# Patient Record
Sex: Female | Born: 1995 | Hispanic: No | Marital: Married
Health system: Southern US, Community
[De-identification: ages and names within clinical notes are randomized; demographics above are authoritative.]

## PROBLEM LIST (undated history)

## (undated) DIAGNOSIS — J45909 Unspecified asthma, uncomplicated: Secondary | ICD-10-CM

## (undated) DIAGNOSIS — R011 Cardiac murmur, unspecified: Secondary | ICD-10-CM

## (undated) HISTORY — PX: TYMPANOPLASTY: SHX33

---
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
## 2017-05-23 ENCOUNTER — Encounter: Payer: Self-pay | Admitting: Emergency Medicine

## 2017-05-23 ENCOUNTER — Emergency Department: Payer: BLUE CROSS/BLUE SHIELD

## 2017-05-23 DIAGNOSIS — W101XXA Fall (on)(from) sidewalk curb, initial encounter: Secondary | ICD-10-CM | POA: Diagnosis not present

## 2017-05-23 DIAGNOSIS — S93401A Sprain of unspecified ligament of right ankle, initial encounter: Secondary | ICD-10-CM | POA: Insufficient documentation

## 2017-05-23 DIAGNOSIS — Y929 Unspecified place or not applicable: Secondary | ICD-10-CM | POA: Insufficient documentation

## 2017-05-23 DIAGNOSIS — S99911A Unspecified injury of right ankle, initial encounter: Secondary | ICD-10-CM | POA: Diagnosis present

## 2017-05-23 DIAGNOSIS — Y93K1 Activity, walking an animal: Secondary | ICD-10-CM | POA: Diagnosis not present

## 2017-05-23 DIAGNOSIS — Y999 Unspecified external cause status: Secondary | ICD-10-CM | POA: Diagnosis not present

## 2017-05-23 NOTE — ED Triage Notes (Signed)
Pt c/o right ankle and foot pain post fall from sidewalk tonight while walking dog. Pt denies hitting head and LOC. Swelling noted to area of injury.

## 2017-05-24 ENCOUNTER — Emergency Department
Admission: EM | Admit: 2017-05-24 | Discharge: 2017-05-24 | Disposition: A | Discharge status: Home / Self Care | Payer: BLUE CROSS/BLUE SHIELD | Attending: Emergency Medicine | Admitting: Emergency Medicine

## 2017-05-24 DIAGNOSIS — S93401A Sprain of unspecified ligament of right ankle, initial encounter: Secondary | ICD-10-CM

## 2017-05-24 DIAGNOSIS — M25571 Pain in right ankle and joints of right foot: Secondary | ICD-10-CM

## 2017-05-24 MED ORDER — HYDROCODONE-ACETAMINOPHEN 5-325 MG PO TABS: 1.0000 | ORAL_TABLET | Freq: Four times a day (QID) | ORAL | 0 refills | Status: DC | PRN

## 2017-05-24 MED ORDER — IBUPROFEN 800 MG PO TABS
800.0000 mg | ORAL_TABLET | Freq: Once | ORAL | Status: AC
  Administered 2017-05-24: 800 mg via ORAL
  Filled 2017-05-24: qty 1

## 2017-05-24 MED ORDER — HYDROCODONE-ACETAMINOPHEN 5-325 MG PO TABS
1.0000 | ORAL_TABLET | Freq: Once | ORAL | Status: AC
  Administered 2017-05-24: 1 via ORAL
  Filled 2017-05-24: qty 1

## 2017-05-24 MED ORDER — IBUPROFEN 800 MG PO TABS: 800.0000 mg | ORAL_TABLET | Freq: Three times a day (TID) | ORAL | 0 refills | Status: DC | PRN

## 2017-05-24 NOTE — ED Provider Notes (Signed)
[facility] Emergency Department Provider Note   ____________________________________________   First MD Initiated Contact with Patient 05/24/17 0304     (approximate)  I have reviewed the triage vital signs and the nursing notes.   HISTORY  Chief Complaint Ankle Pain    HPI Lindsey Thompson is a 21 y.o. female presents with pain status post fall.  Patient reports she was walking her dog when she stepped off the curb and rolled her ankle.  Complains of right ankle pain and swelling.  Denies extremity weakness, numbness or tingling.  Denies striking head or LOC.  Denies headache, vision changes, neck pain, shortness of breath, nausea or vomiting. Nothing makes her symptoms better; ambulation makes her symptoms worse.   Past medical history None  There are no active problems to display for this patient.   History reviewed. No pertinent surgical history.  Prior to Admission medications   Not on File    Allergies Patient has no known allergies.  History reviewed. No pertinent family history.  Social History Social History  Substance Use Topics  . Smoking status: Never Smoker  . Smokeless tobacco: Never Used  . Alcohol use No    Review of Systems  Constitutional: No fever/chills. Eyes: No visual changes. ENT: No sore throat. Cardiovascular: Denies chest pain. Respiratory: Denies shortness of breath. Gastrointestinal: No abdominal pain.  No nausea, no vomiting.  No diarrhea.  No constipation. Genitourinary: Negative for dysuria. Musculoskeletal: Positive for right ankle pain and swelling.  Negative for back pain. Skin: Negative for rash. Neurological: Negative for headaches, focal weakness or numbness.   ____________________________________________   PHYSICAL EXAM:  VITAL SIGNS: ED Triage Vitals  Enc Vitals Group     BP 05/23/17 2241 126/80     Pulse Rate 05/23/17 2241 100     Resp 05/23/17 2241 16     Temp 05/23/17 2241 97.9 F  (36.6 C)     Temp Source 05/23/17 2241 Oral     SpO2 05/23/17 2241 100 %     Weight --      Height 05/23/17 2234 5\' 10"  (1.778 m)     Head Circumference --      Peak Flow --      Pain Score --      Pain Loc --      Pain Edu? --      Excl. in GC? --     Constitutional: Alert and oriented. Well appearing and in no acute distress. Eyes: Conjunctivae are normal. PERRL. EOMI. Head: Atraumatic. Nose: No congestion/rhinnorhea. Mouth/Throat: Mucous membranes are moist.  Oropharynx non-erythematous. Neck: No stridor.  No cervical spine tenderness to palpation. Cardiovascular: Normal rate, regular rhythm. Grossly normal heart sounds.  Good peripheral circulation. Respiratory: Normal respiratory effort.  No retractions. Lungs CTAB. Gastrointestinal: Soft and nontender. No distention. No abdominal bruits. No CVA tenderness. Musculoskeletal:  RLE: Mild to moderate swelling ankle, maximally at lateral malleous and anterior foot.  Limited range of motion secondary to pain.  2+ distal pulses.  Brisk, less than 5-second capillary refill. Neurologic:  Normal speech and language. No gross focal neurologic deficits are appreciated.  Skin:  Skin is warm, dry and intact. No rash noted. Psychiatric: Mood and affect are normal. Speech and behavior are normal.  ____________________________________________   LABS (all labs ordered are listed, but only abnormal results are displayed)  Labs Reviewed - No data to display ____________________________________________  EKG  None ____________________________________________  RADIOLOGY  Dg Ankle 2 Views Right  Result  Date: 05/23/2017 CLINICAL DATA:  Fall with right ankle and foot pain EXAM: RIGHT ANKLE - 2 VIEW COMPARISON:  None. FINDINGS: There is no evidence of fracture, dislocation, or joint effusion. There is no evidence of arthropathy or other focal bone abnormality. Moderate circumferential soft tissue swelling. IMPRESSION: Moderate right ankle  circumferential soft tissue swelling. No acute fracture or dislocation. Electronically Signed   By: Deatra RobinsonKevin  Herman M.D.   On: 05/23/2017 23:25   Dg Foot Complete Right  Result Date: 05/23/2017 CLINICAL DATA:  Fall with ankle and foot pain EXAM: RIGHT FOOT COMPLETE - 3+ VIEW COMPARISON:  None. FINDINGS: There is no evidence of fracture or dislocation. There is no evidence of arthropathy or other focal bone abnormality. Right ankle soft tissue swelling. IMPRESSION: No fracture or dislocation of the right foot. Electronically Signed   By: Deatra RobinsonKevin  Herman M.D.   On: 05/23/2017 23:27    ____________________________________________   PROCEDURES  Procedure(s) performed: None  Procedures  Critical Care performed: No  ____________________________________________   INITIAL IMPRESSION / ASSESSMENT AND PLAN / ED COURSE  As part of my medical decision making, I reviewed the following data within the electronic MEDICAL RECORD NUMBER History obtained from family, Nursing notes reviewed and incorporated, Radiograph reviewed and Notes from prior ED visits.   21 year old female who presents with right ankle sprain s/p fall. Come with her own crutches. Will administer NSAIDs, analgesia, place in ankle stirrup splint, and refer to orthopedics for outpatient follow-up. Strict return precautions given. Patient and family verbalize understanding and agree with plan of care.      ____________________________________________   FINAL CLINICAL IMPRESSION(S) / ED DIAGNOSES  Final diagnoses:  Acute right ankle pain  Sprain of right ankle, unspecified ligament, initial encounter      NEW MEDICATIONS STARTED DURING THIS VISIT:  New Prescriptions   No medications on file     Note:  This document was prepared using Dragon voice recognition software and may include unintentional dictation errors.    Irean HongSung, Hope Holst J, MD 05/24/17 [phone]

## 2017-05-24 NOTE — Discharge Instructions (Signed)
1.  You may take pain medicines as needed (Motrin/Norco #15). 2. Wear splint and use your crutches to walk as needed. 3. Elevate affected area and apply ice over splint several times daily. 4. Return to the ER for worsening symptoms, persistent vomiting, difficulty breathing or other concerns.

## 2017-05-24 NOTE — ED Notes (Signed)
Pt uprite on stretcher in exam room with no distress noted; SO at bedside; pt reports PTA tripped while taking her dog outside; c/o pain right ankle/foot; +PP, brisk cap refill, W&D, good movement/sensation; skin intact; denies any other c/o or injuries

## 2017-11-26 ENCOUNTER — Other Ambulatory Visit: Payer: Self-pay

## 2017-11-26 ENCOUNTER — Emergency Department
Admission: EM | Admit: 2017-11-26 | Discharge: 2017-11-27 | Disposition: A | Discharge status: Home / Self Care | Payer: Managed Care, Other (non HMO) | Attending: Student in an Organized Health Care Education/Training Program | Admitting: Student in an Organized Health Care Education/Training Program

## 2017-11-26 ENCOUNTER — Emergency Department: Payer: Managed Care, Other (non HMO)

## 2017-11-26 ENCOUNTER — Encounter: Payer: Self-pay | Admitting: Emergency Medicine

## 2017-11-26 DIAGNOSIS — R1031 Right lower quadrant pain: Secondary | ICD-10-CM | POA: Diagnosis present

## 2017-11-26 DIAGNOSIS — N83209 Unspecified ovarian cyst, unspecified side: Secondary | ICD-10-CM | POA: Insufficient documentation

## 2017-11-26 LAB — PREGNANCY, URINE: Preg Test, Ur: NEGATIVE

## 2017-11-26 LAB — COMPREHENSIVE METABOLIC PANEL
ALT: 24 U/L (ref 14–54)
ANION GAP: 6 (ref 5–15)
AST: 20 U/L (ref 15–41)
Albumin: 4.1 g/dL (ref 3.5–5.0)
Alkaline Phosphatase: 49 U/L (ref 38–126)
BUN: 11 mg/dL (ref 6–20)
CHLORIDE: 108 mmol/L (ref 101–111)
CO2: 23 mmol/L (ref 22–32)
CREATININE: 0.92 mg/dL (ref 0.44–1.00)
Calcium: 9.4 mg/dL (ref 8.9–10.3)
GLUCOSE: 96 mg/dL (ref 65–99)
Potassium: 3.8 mmol/L (ref 3.5–5.1)
Sodium: 137 mmol/L (ref 135–145)
Total Bilirubin: 0.4 mg/dL (ref 0.3–1.2)
Total Protein: 7.8 g/dL (ref 6.5–8.1)

## 2017-11-26 LAB — LIPASE, BLOOD: Lipase: 30 U/L (ref 11–51)

## 2017-11-26 LAB — CBC
HCT: 42.8 % (ref 35.0–47.0)
Hemoglobin: 14.4 g/dL (ref 12.0–16.0)
MCH: 29.7 pg (ref 26.0–34.0)
MCHC: 33.6 g/dL (ref 32.0–36.0)
MCV: 88.4 fL (ref 80.0–100.0)
PLATELETS: 387 10*3/uL (ref 150–440)
RBC: 4.84 MIL/uL (ref 3.80–5.20)
RDW: 13.8 % (ref 11.5–14.5)
WBC: 11.2 10*3/uL — ABNORMAL HIGH (ref 3.6–11.0)

## 2017-11-26 LAB — URINALYSIS, COMPLETE (UACMP) WITH MICROSCOPIC
BILIRUBIN URINE: NEGATIVE
GLUCOSE, UA: NEGATIVE mg/dL
KETONES UR: NEGATIVE mg/dL
Leukocytes, UA: NEGATIVE
Nitrite: NEGATIVE
PH: 5 (ref 5.0–8.0)
Protein, ur: NEGATIVE mg/dL
Specific Gravity, Urine: 1.011 (ref 1.005–1.030)
WBC UA: NONE SEEN WBC/hpf (ref 0–5)

## 2017-11-26 MED ORDER — IOPAMIDOL (ISOVUE-370) INJECTION 76%
100.0000 mL | Freq: Once | INTRAVENOUS | Status: AC | PRN
  Administered 2017-11-26: 100 mL via INTRAVENOUS

## 2017-11-26 MED ORDER — MORPHINE SULFATE (PF) 4 MG/ML IV SOLN
4.0000 mg | INTRAVENOUS | Status: DC | PRN
Start: 2017-11-26 — End: 2017-11-27
  Administered 2017-11-26: 4 mg via INTRAVENOUS
  Filled 2017-11-26: qty 1

## 2017-11-26 MED ORDER — SODIUM CHLORIDE 0.9 % IV BOLUS
1000.0000 mL | Freq: Once | INTRAVENOUS | Status: AC
  Administered 2017-11-26: 1000 mL via INTRAVENOUS

## 2017-11-26 MED ORDER — PROMETHAZINE HCL 25 MG/ML IJ SOLN
12.5000 mg | Freq: Four times a day (QID) | INTRAMUSCULAR | Status: DC | PRN
Start: 2017-11-26 — End: 2017-11-27
  Administered 2017-11-26: 12.5 mg via INTRAVENOUS
  Filled 2017-11-26: qty 1

## 2017-11-26 NOTE — ED Provider Notes (Signed)
[facility] Emergency Department Provider Note    First MD Initiated Contact with Patient 11/26/17 2147     (approximate)  I have reviewed the triage vital signs and the nursing notes.   HISTORY  Chief Complaint Abdominal Pain    HPI Lindsey Thompson is a 22 y.o. female previously healthy presents with chief complaint of 1 day of initially periumbilical pain that radiates now to the right lower quadrant.  Has had slightly decreased oral intake.  No measured fevers but did feel hot earlier today.  Denies any dysuria or hematuria.  Is never had pain like this before.  States that she is not pregnant.  Last mental cycle was 3 weeks ago.  No history of ovarian cysts or abnormality.  Denies any vaginal discharge or vaginal bleeding.  States the pain is mild to moderate in severity.  History reviewed. No pertinent past medical history. No family history on file. Past Surgical History:  Procedure Laterality Date  . TYMPANOPLASTY     There are no active problems to display for this patient.     Prior to Admission medications   Medication Sig Start Date End Date Taking? Authorizing Provider  HYDROcodone-acetaminophen (NORCO) 5-325 MG tablet Take 1 tablet by mouth every 6 (six) hours as needed for moderate pain. 05/24/17   Irean Hong, MD  ibuprofen (ADVIL,MOTRIN) 800 MG tablet Take 1 tablet (800 mg total) by mouth every 8 (eight) hours as needed for moderate pain. 05/24/17   Irean Hong, MD    Allergies Patient has no known allergies.    Social History Social History   Tobacco Use  . Smoking status: Never Smoker  . Smokeless tobacco: Never Used  Substance Use Topics  . Alcohol use: No  . Drug use: No    Review of Systems Patient denies headaches, rhinorrhea, blurry vision, numbness, shortness of breath, chest pain, edema, cough, abdominal pain, nausea, vomiting, diarrhea, dysuria, fevers, rashes or hallucinations unless otherwise stated above in  HPI. ____________________________________________   PHYSICAL EXAM:  VITAL SIGNS: Vitals:   11/26/17 2216 11/26/17 2230  BP:  133/83  Pulse: 89 98  Resp:    Temp:    SpO2: 100% 100%    Constitutional: Alert and oriented. Well appearing and in no acute distress. Eyes: Conjunctivae are normal.  Head: Atraumatic. Nose: No congestion/rhinnorhea. Mouth/Throat: Mucous membranes are moist.   Neck: No stridor. Painless ROM.  Cardiovascular: Normal rate, regular rhythm. Grossly normal heart sounds.  Good peripheral circulation. Respiratory: Normal respiratory effort.  No retractions. Lungs CTAB. Gastrointestinal: Soft with ttp of rlq. No distention. No abdominal bruits. No CVA tenderness. Genitourinary: deferred Musculoskeletal: No lower extremity tenderness nor edema.  No joint effusions. Neurologic:  Normal speech and language. No gross focal neurologic deficits are appreciated. No facial droop Skin:  Skin is warm, dry and intact. No rash noted. Psychiatric: Mood and affect are normal. Speech and behavior are normal.  ____________________________________________   LABS (all labs ordered are listed, but only abnormal results are displayed)  Results for orders placed or performed during the hospital encounter of 11/26/17 (from the past 24 hour(s))  CBC     Status: Abnormal   Collection Time: 11/26/17  7:20 PM  Result Value Ref Range   WBC 11.2 (H) 3.6 - 11.0 K/uL   RBC 4.84 3.80 - 5.20 MIL/uL   Hemoglobin 14.4 12.0 - 16.0 g/dL   HCT 16.1 09.6 - 04.5 %   MCV 88.4 80.0 - 100.0 fL  MCH 29.7 26.0 - 34.0 pg   MCHC 33.6 32.0 - 36.0 g/dL   RDW 40.9 81.1 - 91.4 %   Platelets 387 150 - 440 K/uL  Comprehensive metabolic panel     Status: None   Collection Time: 11/26/17  7:20 PM  Result Value Ref Range   Sodium 137 135 - 145 mmol/L   Potassium 3.8 3.5 - 5.1 mmol/L   Chloride 108 101 - 111 mmol/L   CO2 23 22 - 32 mmol/L   Glucose, Bld 96 65 - 99 mg/dL   BUN 11 6 - 20 mg/dL    Creatinine, Ser 7.82 0.44 - 1.00 mg/dL   Calcium 9.4 8.9 - 95.6 mg/dL   Total Protein 7.8 6.5 - 8.1 g/dL   Albumin 4.1 3.5 - 5.0 g/dL   AST 20 15 - 41 U/L   ALT 24 14 - 54 U/L   Alkaline Phosphatase 49 38 - 126 U/L   Total Bilirubin 0.4 0.3 - 1.2 mg/dL   GFR calc non Af Amer >60 >60 mL/min   GFR calc Af Amer >60 >60 mL/min   Anion gap 6 5 - 15  Lipase, blood     Status: None   Collection Time: 11/26/17  7:20 PM  Result Value Ref Range   Lipase 30 11 - 51 U/L  Urinalysis, Complete w Microscopic     Status: Abnormal   Collection Time: 11/26/17  7:21 PM  Result Value Ref Range   Color, Urine YELLOW (A) YELLOW   APPearance CLEAR (A) CLEAR   Specific Gravity, Urine 1.011 1.005 - 1.030   pH 5.0 5.0 - 8.0   Glucose, UA NEGATIVE NEGATIVE mg/dL   Hgb urine dipstick SMALL (A) NEGATIVE   Bilirubin Urine NEGATIVE NEGATIVE   Ketones, ur NEGATIVE NEGATIVE mg/dL   Protein, ur NEGATIVE NEGATIVE mg/dL   Nitrite NEGATIVE NEGATIVE   Leukocytes, UA NEGATIVE NEGATIVE   RBC / HPF 0-5 0 - 5 RBC/hpf   WBC, UA NONE SEEN 0 - 5 WBC/hpf   Bacteria, UA RARE (A) NONE SEEN   Squamous Epithelial / LPF 0-5 0 - 5  Pregnancy, urine     Status: None   Collection Time: 11/26/17  9:46 PM  Result Value Ref Range   Preg Test, Ur NEGATIVE NEGATIVE   ____________________________________________ ____________________________________________  RADIOLOGY  CT abd ordered  ____________________________________________   PROCEDURES  Procedure(s) performed:  Procedures    Critical Care performed: no ____________________________________________   INITIAL IMPRESSION / ASSESSMENT AND PLAN / ED COURSE  Pertinent labs & imaging results that were available during my care of the patient were reviewed by me and considered in my medical decision making (see chart for details).  DDX: Appendicitis, UTI, stone, enteritis, colitis, IBD, pregnancy  [name] is a 22 y.o. who presents to the ED with symptoms as  described above.  Patient with tenderness on exam with mild leukocytosis.  No significant pyuria.  Based on presentation I am concerned for acute appendicitis therefore will order IV fluids, IV pain medication, IV antiemetics and CT imaging.      As part of my medical decision making, I reviewed the following data within the electronic MEDICAL RECORD NUMBER Nursing notes reviewed and incorporated, Labs reviewed, notes from prior ED visits.   ____________________________________________   FINAL CLINICAL IMPRESSION(S) / ED DIAGNOSES  Final diagnoses:  RLQ abdominal pain      NEW MEDICATIONS STARTED DURING THIS VISIT:  New Prescriptions   No medications on file  Note:  This document was prepared using Dragon voice recognition software and may include unintentional dictation errors.    Willy Eddy, MD 11/26/17 [phone]

## 2017-11-26 NOTE — Discharge Instructions (Addendum)

## 2017-11-26 NOTE — ED Triage Notes (Signed)
Patient ambulatory to triage with steady gait, without difficulty or distress noted; pt reports mid lower abd pain now radiating into right lower abd accomp by nausea since yesterday; denies hx of same

## 2017-11-27 ENCOUNTER — Emergency Department: Payer: Managed Care, Other (non HMO)

## 2017-11-27 DIAGNOSIS — N83209 Unspecified ovarian cyst, unspecified side: Secondary | ICD-10-CM | POA: Diagnosis not present

## 2017-11-27 MED ORDER — HYDROCODONE-ACETAMINOPHEN 5-325 MG PO TABS: 1.0000 | ORAL_TABLET | ORAL | 0 refills | Status: DC | PRN

## 2017-11-27 MED ORDER — HYDROCODONE-ACETAMINOPHEN 5-325 MG PO TABS
2.0000 | ORAL_TABLET | Freq: Once | ORAL | Status: AC
  Administered 2017-11-27: 2 via ORAL
  Filled 2017-11-27: qty 2

## 2017-11-27 MED ORDER — PROCHLORPERAZINE MALEATE 10 MG PO TABS: 10.0000 mg | ORAL_TABLET | Freq: Four times a day (QID) | ORAL | 0 refills | Status: DC | PRN

## 2017-11-27 MED ORDER — IBUPROFEN 600 MG PO TABS
600.0000 mg | ORAL_TABLET | Freq: Once | ORAL | Status: AC
  Administered 2017-11-27: 600 mg via ORAL
  Filled 2017-11-27: qty 1

## 2017-11-27 NOTE — ED Provider Notes (Signed)
Care signed over from Dr. Roxan Hockey pending ultrasound of the pelvis to evaluate for ovarian torsion.  The patient's ovaries have good flow.  Her history and exam are consistent with ruptured ovarian cyst.  The patient's pain is.  Discharged home with strict return precautions according to Dr. Danella Penton plan.   Merrily Brittle, MD 11/27/17 [phone]

## 2017-11-27 NOTE — ED Notes (Signed)
Patient ambulatory to lobby with steady gait and NAD noted. Verbalized understanding of discharge instructions and follow-up care.  

## 2018-06-12 ENCOUNTER — Other Ambulatory Visit
Admission: RE | Admit: 2018-06-12 | Discharge: 2018-06-12 | Disposition: A | Discharge status: Home / Self Care | Payer: BLUE CROSS/BLUE SHIELD | Source: Ambulatory Visit | Attending: Physician Assistant | Admitting: Physician Assistant

## 2018-06-12 ENCOUNTER — Other Ambulatory Visit: Payer: Self-pay | Admitting: Physician Assistant

## 2018-06-12 ENCOUNTER — Ambulatory Visit
Admission: RE | Admit: 2018-06-12 | Discharge: 2018-06-12 | Disposition: A | Discharge status: Home / Self Care | Payer: Managed Care, Other (non HMO) | Source: Ambulatory Visit | Attending: Physician Assistant | Admitting: Physician Assistant

## 2018-06-12 DIAGNOSIS — R072 Precordial pain: Secondary | ICD-10-CM | POA: Insufficient documentation

## 2018-06-12 LAB — TROPONIN I: Troponin I: <0.03 ng/mL (ref <0.03)

## 2018-06-12 MED ORDER — IOHEXOL 350 MG/ML SOLN
75.0000 mL | Freq: Once | INTRAVENOUS | Status: AC | PRN
  Administered 2018-06-12: 75 mL via INTRAVENOUS

## 2019-12-15 ENCOUNTER — Other Ambulatory Visit: Payer: Self-pay | Admitting: Internal Medicine

## 2019-12-15 DIAGNOSIS — M5442 Lumbago with sciatica, left side: Secondary | ICD-10-CM

## 2019-12-29 ENCOUNTER — Telehealth: Payer: Self-pay | Admitting: Internal Medicine

## 2019-12-29 NOTE — Telephone Encounter (Signed)
12/29/19~MR L-Spine WO Contrast DENIED by [facility]. Appt cnld w patient. Advised patient o f/u w ref MD. [name]

## 2019-12-31 ENCOUNTER — Ambulatory Visit: Payer: Managed Care, Other (non HMO)

## 2020-01-23 ENCOUNTER — Ambulatory Visit
Admission: RE | Admit: 2020-01-23 | Discharge: 2020-01-23 | Disposition: A | Discharge status: Home / Self Care | Payer: Managed Care, Other (non HMO) | Source: Ambulatory Visit | Attending: Internal Medicine | Admitting: Internal Medicine

## 2020-01-23 ENCOUNTER — Other Ambulatory Visit: Payer: Self-pay

## 2020-01-23 DIAGNOSIS — M5442 Lumbago with sciatica, left side: Secondary | ICD-10-CM | POA: Insufficient documentation

## 2020-01-23 DIAGNOSIS — M5441 Lumbago with sciatica, right side: Secondary | ICD-10-CM | POA: Insufficient documentation

## 2021-08-29 ENCOUNTER — Other Ambulatory Visit: Payer: Self-pay | Admitting: Physical Medicine & Rehabilitation

## 2021-08-29 DIAGNOSIS — G8929 Other chronic pain: Secondary | ICD-10-CM

## 2021-09-01 ENCOUNTER — Other Ambulatory Visit: Payer: Self-pay | Admitting: Physical Medicine & Rehabilitation

## 2021-09-01 DIAGNOSIS — M5416 Radiculopathy, lumbar region: Secondary | ICD-10-CM

## 2021-09-05 ENCOUNTER — Ambulatory Visit: Payer: Managed Care, Other (non HMO)

## 2021-09-23 ENCOUNTER — Ambulatory Visit
Admission: RE | Admit: 2021-09-23 | Discharge: 2021-09-23 | Disposition: A | Discharge status: Home / Self Care | Payer: Managed Care, Other (non HMO) | Source: Ambulatory Visit | Attending: Physical Medicine & Rehabilitation | Admitting: Physical Medicine & Rehabilitation

## 2021-09-23 ENCOUNTER — Other Ambulatory Visit: Payer: Self-pay

## 2021-09-23 DIAGNOSIS — M5416 Radiculopathy, lumbar region: Secondary | ICD-10-CM

## 2022-03-05 DIAGNOSIS — O0993 Supervision of high risk pregnancy, unspecified, third trimester: Secondary | ICD-10-CM | POA: Insufficient documentation

## 2022-07-26 ENCOUNTER — Encounter
Admission: RE | Admit: 2022-07-26 | Discharge: 2022-07-26 | Disposition: A | Discharge status: Home / Self Care | Payer: Managed Care, Other (non HMO) | Source: Ambulatory Visit | Attending: Anesthesiology | Admitting: Anesthesiology

## 2022-07-26 NOTE — Consult Note (Addendum)
[facility] Anesthesia Consultation  Lindsey Thompson KPT:[id number] DOB: September 14, 1995 DOA: 07/26/2022 PCP: Lindsey Crouch, MD   Requesting physician: Dr. Leafy Thompson Date of consultation: 07/26/2022 Reason for consultation: Obesity during pregnancy  CHIEF COMPLAINT:  Obesity during pregnancy  HISTORY OF PRESENT ILLNESS: Lindsey Thompson  is a 27 y.o. female with a known history of asthma as a child who is presenting for evaluation for obesity in pregnancy  PAST MEDICAL HISTORY:  No past medical history on file.  PAST SURGICAL HISTORY:  Past Surgical History:  Procedure Laterality Date   TYMPANOPLASTY      SOCIAL HISTORY:  Social History   Tobacco Use   Smoking status: Not on file   Smokeless tobacco: Never  Substance Use Topics   Alcohol use: No    FAMILY HISTORY: No family history on file.  DRUG ALLERGIES: No Known Allergies  REVIEW OF SYSTEMS:   RESPIRATORY: No cough, shortness of breath, wheezing.  CARDIOVASCULAR: No chest pain, orthopnea, edema.  HEMATOLOGY: No anemia, easy bruising or bleeding SKIN: No rash or lesion. NEUROLOGIC: No tingling, numbness, weakness.  PSYCHIATRY: No anxiety or depression.   MEDICATIONS AT HOME:  Prior to Admission medications   Medication Sig Start Date End Date Taking? Authorizing Provider  HYDROcodone-acetaminophen (NORCO) 5-325 MG tablet Take 1 tablet by mouth every 6 (six) hours as needed for moderate pain. 05/24/17   Lindsey Blanch, MD  HYDROcodone-acetaminophen (NORCO) 5-325 MG tablet Take 1 tablet by mouth every 4 (four) hours as needed for moderate pain. 11/27/17   Lindsey Lot, MD  ibuprofen (ADVIL,MOTRIN) 800 MG tablet Take 1 tablet (800 mg total) by mouth every 8 (eight) hours as needed for moderate pain. 05/24/17   Lindsey Blanch, MD  prochlorperazine (COMPAZINE) 10 MG tablet Take 1 tablet (10 mg total) by mouth every 6 (six) hours as needed for nausea or vomiting. 11/27/17   Lindsey Lot,  MD      PHYSICAL EXAMINATION:   VITAL SIGNS: There were no vitals taken for this visit.  GENERAL:  27 y.o.-year-old patient no acute distress.  HEENT: Head atraumatic, normocephalic. Oropharynx and nasopharynx clear. MP 2, TM distance >3 cm, normal mouth opening. LUNGS: No use of accessory muscles of respiration.   EXTREMITIES: No pedal edema, cyanosis, or clubbing.  NEUROLOGIC: normal gait PSYCHIATRIC: The patient is alert and oriented x 3.  SKIN: No obvious rash, lesion, or ulcer.    IMPRESSION AND PLAN:   Lindsey Thompson  is a 27 y.o. female presenting with obesity during pregnancy. BMI is currently 47 at [redacted] weeks gestation.   We discussed analgesic options during labor including epidural analgesia. Discussed that in obesity there can be increased difficulty with epidural placement or even failure of successful epidural. We also discussed that even after successful epidural placement there is increased risk of catheter migration out of the epidural space that would require catheter replacement. Discussed use of epidural vs spinal vs GA if cesarean delivery is required. Discussed increased risk of difficult intubation during pregnancy should an emergency cesarean delivery be required.   Patient has been cleared to deliver at [facility] at this time given her good looking airway exam and palpable spinous processes.

## 2022-07-31 ENCOUNTER — Other Ambulatory Visit: Payer: Managed Care, Other (non HMO)

## 2022-08-04 IMAGING — MR MR LUMBAR SPINE W/O CM
4 of 6 series · 20 of 48 positions shown · non-contrast
Comparison: MRI lumbar spine dated January 23, 2020

CLINICAL DATA: Low back pain with bilateral buttocks and leg pain
for 5 years.

EXAM:
MRI LUMBAR SPINE WITHOUT CONTRAST
TECHNIQUE: Multiplanar, multisequence MR imaging of the lumbar spine was
performed. No intravenous contrast was administered.

[Series 7: T1 · sagittal · 4.0mm · 0.73mm/px · 3 of 15 slices shown (1 of 2)]
[im 1/15]
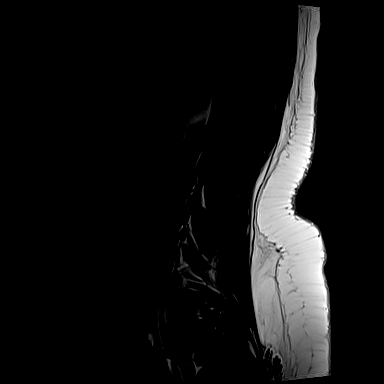
[im 8/15]
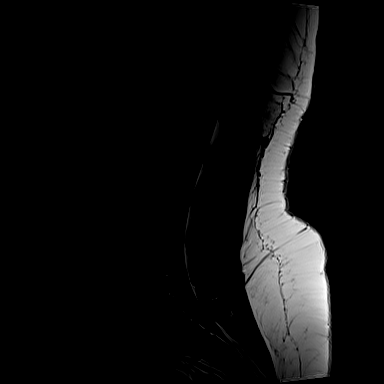
[im 15/15]
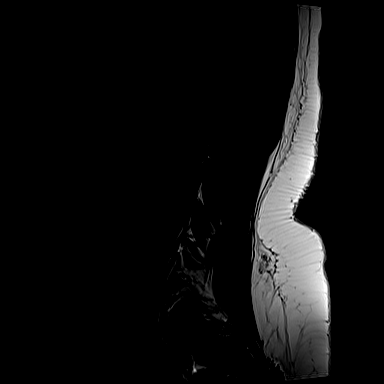

[Series 8: T2 · sagittal · 4.0mm · 0.73mm/px · 5 of 15 slices shown (1 of 2)]
[im 1/15]
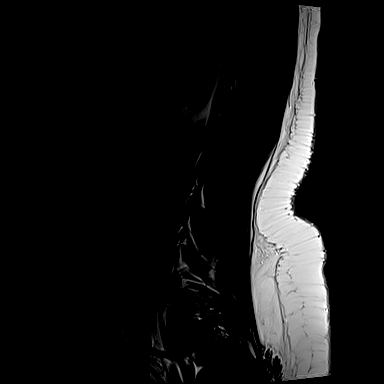
[im 4/15]
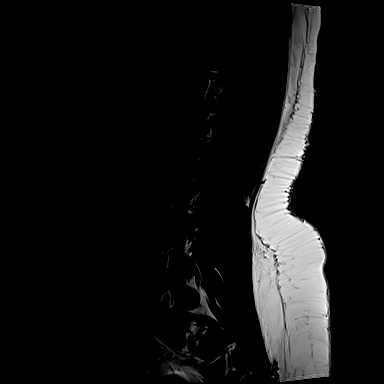
[im 8/15]
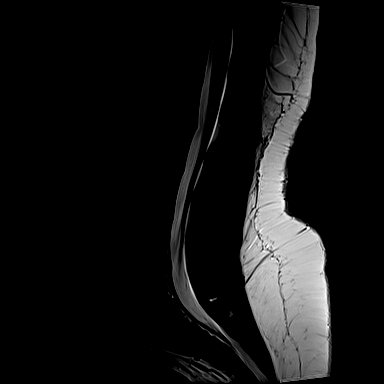
[im 11/15]
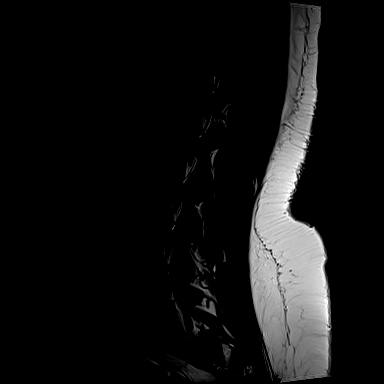
[im 15/15]
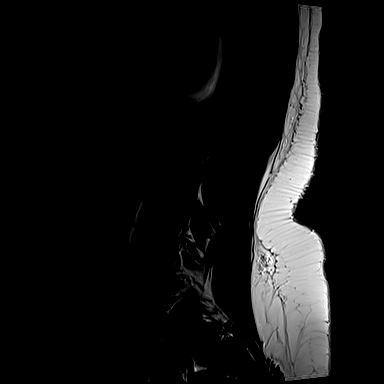

[Series 9: T1 · axial · 4.0mm · 0.56mm/px · z∈[-51,+94]mm · 3 of 34 slices shown (2 of 2)]
[im 4/34]
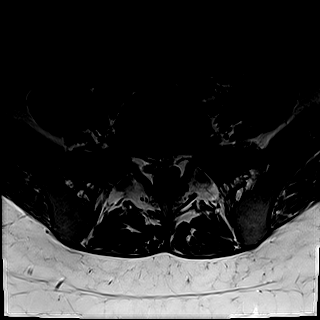
[im 17/34]
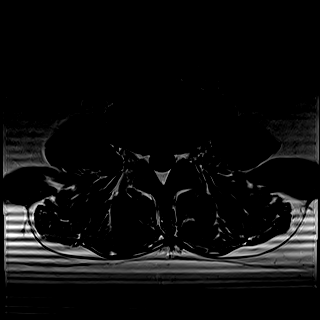
[im 30/34]
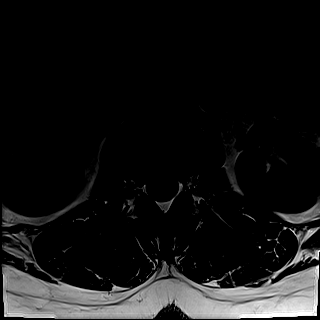

[Series 10: T2 · axial · 4.0mm · 0.28mm/px · z∈[-66,+94]mm · 9 of 34 slices shown (2 of 2)]
[im 1/34]
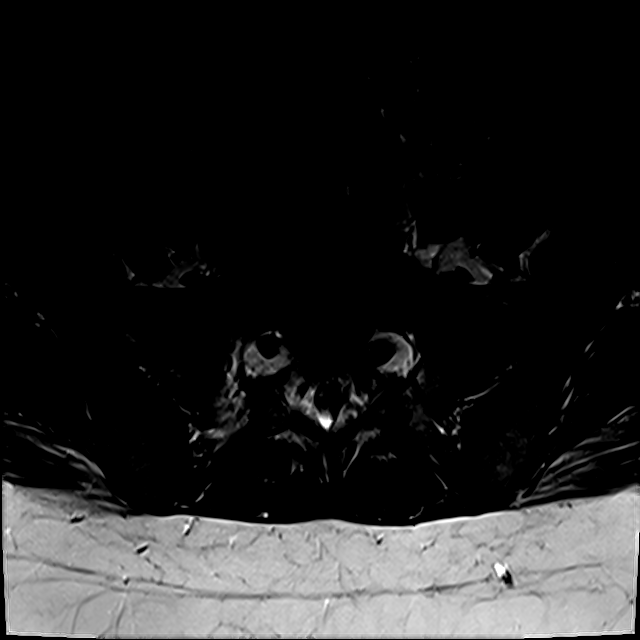
[im 4/34]
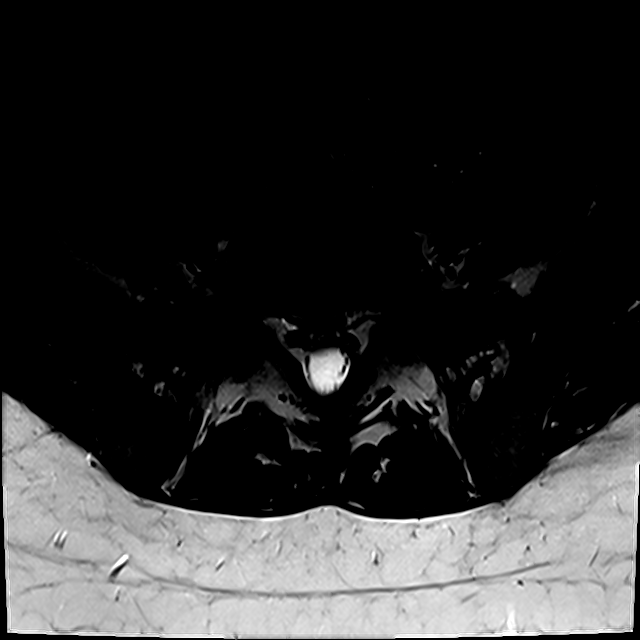
[im 7/34]
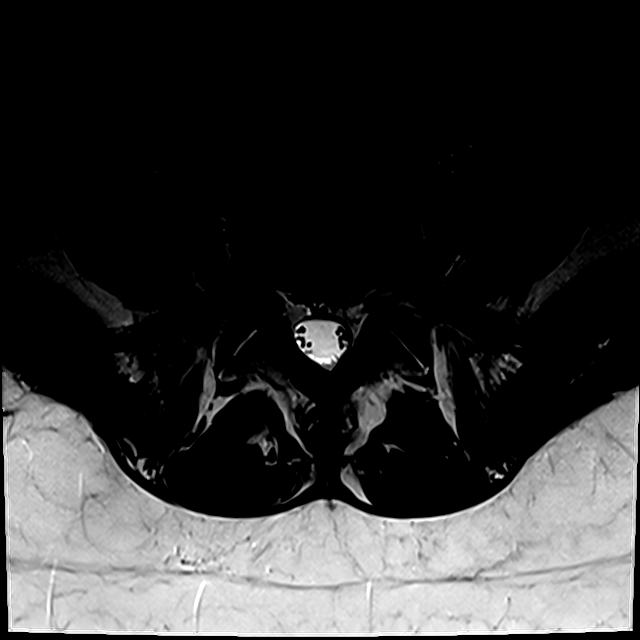
[im 10/34]
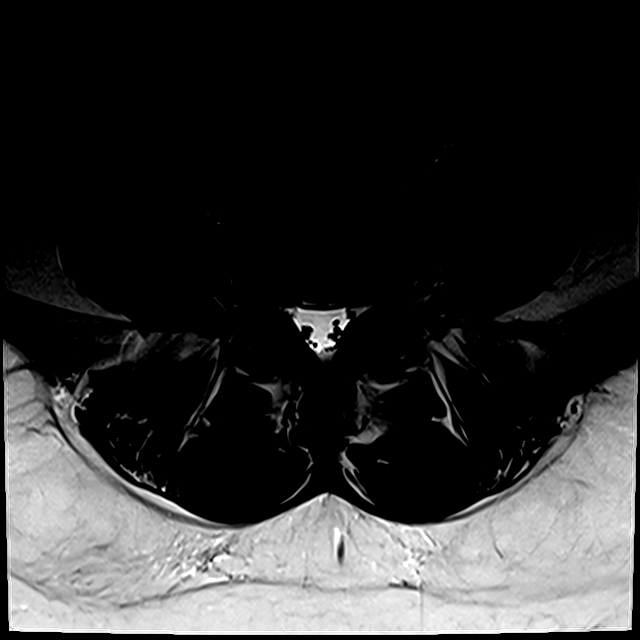
[im 14/34]
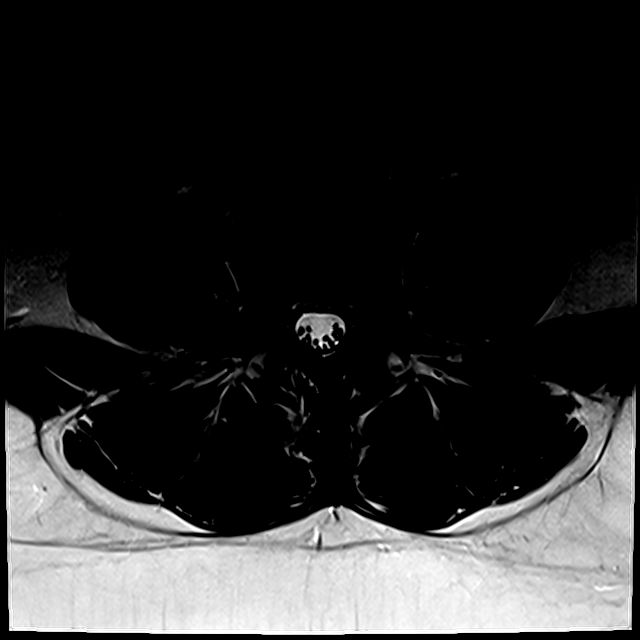
[im 17/34]
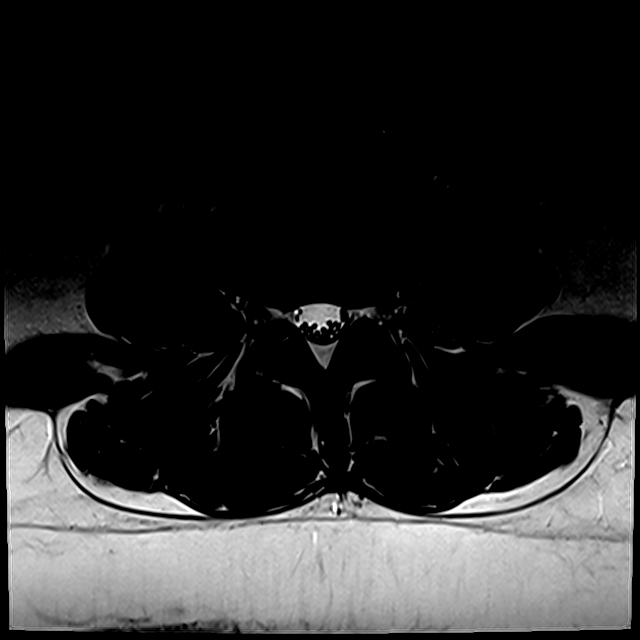
[im 20/34]
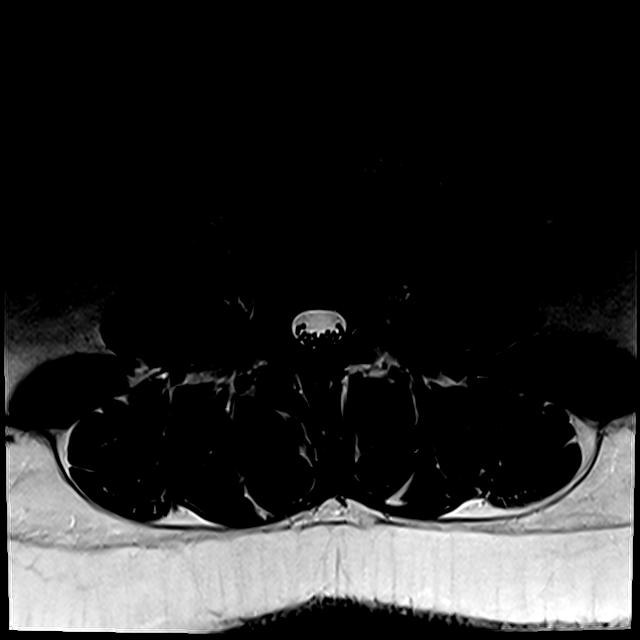
[im 24/34]
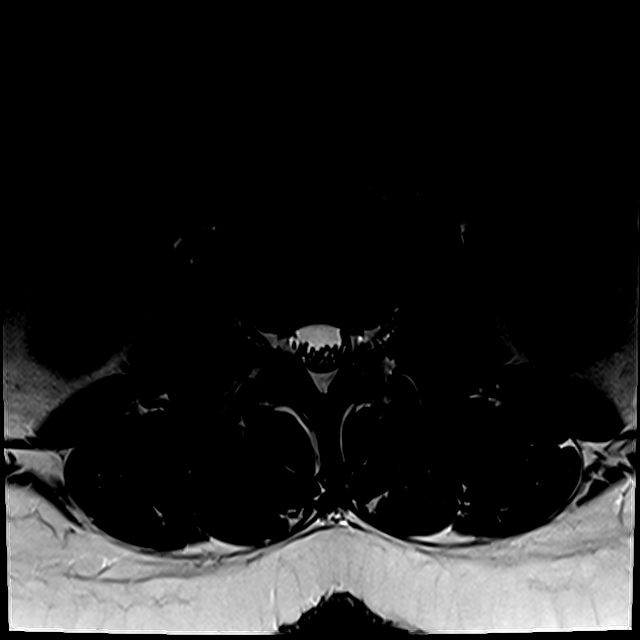
[im 30/34]
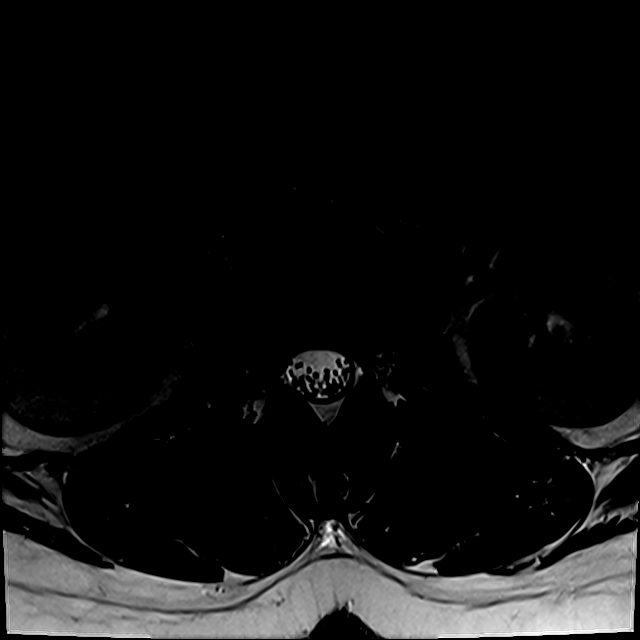

[20 of 48 positions shown; findings below may reference images not displayed]

FINDINGS: Segmentation:  Standard.

Alignment:  Physiologic.

Vertebrae: No fracture, evidence of discitis, or bone lesion.
Prominent Schmorl node in the inferior endplate of L1 is again
noted. There is interval decrease in surrounding edema.

Conus medullaris and cauda equina: Conus extends to the L1 level.
Conus and cauda equina appear normal.

Paraspinal and other soft tissues: Negative.

Disc levels:

T12-L1: No significant disc bulge. No neural foraminal stenosis. No
central canal stenosis.

L1-L2: Mild central disc protrusion. No neural foraminal stenosis.
No central canal stenosis. Schmorl node in the inferior endplate of
L1 with mild surrounding edema

L2-L3: No significant disc bulge. No neural foraminal stenosis. No
central canal stenosis.

L3-L4: Disc desiccation. No neural foraminal stenosis. No central
canal stenosis.

L4-L5: Circumferential disc bulge with mild narrowing of bilateral
lateral recesses. Bilateral facet joint arthropathy. No significant
neural foraminal narrowing.

L5-S1: Circumferential disc bulge with mild focal inferior disc
extrusion. Bilateral ligamentum flavum hypertrophy. No significant
spinal canal or neural foraminal narrowing. Mild bilateral facet
joint arthropathy.
IMPRESSION: 1. Degenerate disc disease prominent at L4-L5 and L5-S1. Narrowing
of bilateral lateral recesses at L4-L5 with encroachment of L5 nerve
roots bilaterally. These findings are not significantly changed
since prior examination.

2. Schmorl node in the anterior aspect of the inferior endplate of
L1 with mild surrounding edema, which have decreased from prior
examination.

## 2022-08-22 ENCOUNTER — Other Ambulatory Visit: Payer: Self-pay | Admitting: Obstetrics and Gynecology

## 2022-08-22 DIAGNOSIS — O3663X0 Maternal care for excessive fetal growth, third trimester, not applicable or unspecified: Secondary | ICD-10-CM

## 2022-08-28 ENCOUNTER — Other Ambulatory Visit: Payer: Managed Care, Other (non HMO)

## 2022-09-06 ENCOUNTER — Ambulatory Visit: Payer: Managed Care, Other (non HMO)

## 2022-09-10 ENCOUNTER — Ambulatory Visit: Payer: Managed Care, Other (non HMO)

## 2022-09-10 ENCOUNTER — Ambulatory Visit
Admission: RE | Admit: 2022-09-10 | Discharge: 2022-09-10 | Disposition: A | Discharge status: Home / Self Care | Payer: BC Managed Care – PPO | Source: Ambulatory Visit | Attending: Obstetrics and Gynecology | Admitting: Obstetrics and Gynecology

## 2022-09-10 DIAGNOSIS — O3663X0 Maternal care for excessive fetal growth, third trimester, not applicable or unspecified: Secondary | ICD-10-CM | POA: Diagnosis present

## 2022-10-12 ENCOUNTER — Encounter: Payer: Self-pay | Admitting: *Deleted

## 2022-10-12 ENCOUNTER — Other Ambulatory Visit: Payer: Self-pay

## 2022-10-12 ENCOUNTER — Encounter
Admission: RE | Admit: 2022-10-12 | Discharge: 2022-10-12 | Disposition: A | Discharge status: Home / Self Care | Payer: Managed Care, Other (non HMO) | Source: Ambulatory Visit | Attending: Obstetrics and Gynecology | Admitting: Obstetrics and Gynecology

## 2022-10-12 HISTORY — DX: Cardiac murmur, unspecified: R01.1

## 2022-10-12 HISTORY — DX: Unspecified asthma, uncomplicated: J45.909

## 2022-10-12 NOTE — Patient Instructions (Addendum)
Your procedure is scheduled on: Tuesday October 16, 2022.  Arrival Time: Please call Labor and Delivery the day before your scheduled C-Section to find out your arrival time. [phone].  Arrival: If your arrival time is prior to 6:00 am, please enter through the Emergency Room Entrance and you will be directed to Labor and Delivery. If your arrival time is 6:00 am or later, please enter the [place] and follow the greeter's instructions.  REMEMBER: Instructions that are not followed completely may result in serious medical risk, up to and including death; or upon the discretion of your surgeon and anesthesiologist your surgery may need to be rescheduled.  Do not eat food after midnight the night before surgery.  No gum chewing or hard candies.  You may however, drink CLEAR liquids up to 2 hours before you are scheduled to arrive for your surgery. Do not drink anything within 2 hours of your scheduled arrival time.  Clear liquids include: - water  - apple juice without pulp - gatorade (not RED colors) - black coffee or tea (Do NOT add milk or creamers to the coffee or tea) Do NOT drink anything that is not on this list.   One week prior to surgery: Stop Anti-inflammatories (NSAIDS) such as Advil, Aleve, Ibuprofen, Motrin, Naproxen, Naprosyn and Aspirin based products such as Excedrin, Goody's Powder, BC Powder. Stop ANY OVER THE COUNTER supplements until after surgery. You may however, continue to take Tylenol if needed for pain up until the day of surgery.  Continue taking all prescribed medications with the exception of the following:  Follow recommendations from Cardiologist or PCP regarding stopping blood thinners.  TAKE ONLY THESE MEDICATIONS THE MORNING OF SURGERY WITH A SIP OF WATER:  None    No Alcohol for 24 hours before or after surgery.  No Smoking including e-cigarettes for 24 hours prior to surgery.  No chewable tobacco products for at least 6 hours prior to  surgery.  No nicotine patches on the day of surgery.  Do not use any "recreational" drugs for at least a week prior to your surgery.  Please be advised that the combination of cocaine and anesthesia may have negative outcomes, up to and including death. If you test positive for cocaine, your surgery will be cancelled.  On the morning of surgery brush your teeth with toothpaste and water, you may rinse your mouth with mouthwash if you wish. Do not swallow any toothpaste or mouthwash.  Use CHG wipes as directed on instruction sheet.  Do not wear jewelry, make-up, hairpins, clips or nail polish.  Do not wear lotions, powders, or perfumes.   Do not shave body hair from the neck down 48 hours before surgery.  Contact lenses, hearing aids and dentures may not be worn into surgery.  Do not bring valuables to the hospital. [facility] is not responsible for any missing/lost belongings or valuables.   Notify your doctor if there is any change in your medical condition (cold, fever, infection).  Wear comfortable clothing (specific to your surgery type) to the hospital.  After surgery, you can help prevent lung complications by doing breathing exercises.  Take deep breaths and cough every 1-2 hours. Your doctor may order a device called an Incentive Spirometer to help you take deep breaths. When coughing or sneezing, hold a pillow firmly against your incision with both hands. This is called "splinting." Doing this helps protect your incision. It also decreases belly discomfort.  Please call the [place] [facility] at [phone] if you have any questions about these instructions.  Surgery Visitation Policy:  Support Person  The designated support person is not considered a visitor.  Any inpatient support person will be given a band identifying them as the patient's chosen support person and may stay with the patient around the clock.  Visitor Passes   All visitors, including  children, need an identification sticker when visiting. These stickers must be worn where they can be seen.   Labor & Delivery  Laboring women of any age may have one designated support person and one other visitor aged 63 and older at a time, and a doula registered with [facility] for the labor and delivery phase of their stay. (Doulas not registered with [facility] are counted as visitors.) The visitor may switch with other visitors. Visitation is allowed 24 hours per day.  No children under the age of 67. The designated support person or a visitor may stay overnight in the room.  Mother Baby Unit, OB Specialty and Gynecological Care  A designated support person and three other visitors of any age may visit. The three visitors may switch out. The designated support person or a visitor aged 90 or older may stay overnight in the room. During the postpartum period (up to 6 weeks), if the mother is the patient, she can have her newborn stay with her if there is another support person present who can be responsible for the baby.   Preparing the Skin Before Surgery     To help prevent the risk of infection at your surgical site, we are now providing you with rinse-free Sage 2% Chlorhexidine Gluconate (CHG) disposable wipes.  Chlorhexidine Gluconate (CHG) Soap  o An antiseptic cleaner that kills germs and bonds with the skin to continue killing germs even after washing  o Used for showering the night before surgery and morning of surgery  The night before surgery: Shower or bathe with warm water. Do not apply perfume, lotions, powders. Wait one hour after shower. Skin should be dry and cool. Open Sage wipe package - use 6 disposable cloths. Wipe body using one cloth for the right arm, one cloth for the left arm, one cloth for the right leg, one cloth for the left leg, one cloth for the chest/abdomen area, and one cloth for the back. Do not use on open wounds or sores. Do not use on  face or genitals (private parts). If you are breast feeding, do not use on breasts. 5. Do not rinse, allow to dry. 6. Skin may feel "tacky" for several minutes. 7. Dress in clean clothes. 8. Place clean sheets on your bed and do not sleep with pets.  REPEAT ABOVE ON THE MORNING OF SURGERY BEFORE ARRIVING TO [place].  How to Use an Incentive Spirometer  An incentive spirometer is a tool that measures how well you are filling your lungs with each breath. Learning to take long, deep breaths using this tool can help you keep your lungs clear and active. This may help to reverse or lessen your chance of developing breathing (pulmonary) problems, especially infection. You may be asked to use a spirometer: After a surgery. If you have a lung problem or a history of smoking. After a long period of time when you have been unable to move or be active. If the spirometer includes an indicator to show the highest number that you have reached, your health care provider or respiratory therapist will help you set a  goal. Keep a log of your progress as told by your health care provider. What are the risks? Breathing too quickly may cause dizziness or cause you to pass out. Take your time so you do not get dizzy or light-headed. If you are in pain, you may need to take pain medicine before doing incentive spirometry. It is harder to take a deep breath if you are having pain. How to use your incentive spirometer  Sit up on the edge of your bed or on a chair. Hold the incentive spirometer so that it is in an upright position. Before you use the spirometer, breathe out normally. Place the mouthpiece in your mouth. Make sure your lips are closed tightly around it. Breathe in slowly and as deeply as you can through your mouth, causing the piston or the ball to rise toward the top of the chamber. Hold your breath for 3-5 seconds, or for as long as possible. If the spirometer includes a coach indicator, use  this to guide you in breathing. Slow down your breathing if the indicator goes above the marked areas. Remove the mouthpiece from your mouth and breathe out normally. The piston or ball will return to the bottom of the chamber. Rest for a few seconds, then repeat the steps 10 or more times. Take your time and take a few normal breaths between deep breaths so that you do not get dizzy or light-headed. Do this every 1-2 hours when you are awake. If the spirometer includes a goal marker to show the highest number you have reached (best effort), use this as a goal to work toward during each repetition. After each set of 10 deep breaths, cough a few times. This will help to make sure that your lungs are clear. If you have an incision on your chest or abdomen from surgery, place a pillow or a rolled-up towel firmly against the incision when you cough. This can help to reduce pain while taking deep breaths and coughing. General tips When you are able to get out of bed: Walk around often. Continue to take deep breaths and cough in order to clear your lungs. Keep using the incentive spirometer until your health care provider says it is okay to stop using it. If you have been in the hospital, you may be told to keep using the spirometer at home. Contact a health care provider if: You are having difficulty using the spirometer. You have trouble using the spirometer as often as instructed. Your pain medicine is not giving enough relief for you to use the spirometer as told. You have a fever. Get help right away if: You develop shortness of breath. You develop a cough with bloody mucus from the lungs. You have fluid or blood coming from an incision site after you cough. Summary An incentive spirometer is a tool that can help you learn to take long, deep breaths to keep your lungs clear and active. You may be asked to use a spirometer after a surgery, if you have a lung problem or a history of smoking, or  if you have been inactive for a long period of time. Use your incentive spirometer as instructed every 1-2 hours while you are awake. If you have an incision on your chest or abdomen, place a pillow or a rolled-up towel firmly against your incision when you cough. This will help to reduce pain. Get help right away if you have shortness of breath, you cough up bloody mucus, or blood  comes from your incision when you cough. This information is not intended to replace advice given to you by your health care provider. Make sure you discuss any questions you have with your health care provider. Document Revised: 09/28/2019 Document Reviewed: 09/28/2019 Elsevier Patient Education  [place].

## 2022-10-15 ENCOUNTER — Encounter: Payer: Self-pay | Admitting: Obstetrics and Gynecology

## 2022-10-15 ENCOUNTER — Inpatient Hospital Stay
Admission: EM | Admit: 2022-10-15 | Discharge: 2022-10-18 | DRG: 787 | Disposition: A | Discharge status: Home / Self Care | Payer: BC Managed Care – PPO | Attending: Obstetrics and Gynecology | Admitting: Obstetrics and Gynecology

## 2022-10-15 ENCOUNTER — Other Ambulatory Visit: Payer: Self-pay

## 2022-10-15 ENCOUNTER — Encounter
Admission: RE | Admit: 2022-10-15 | Discharge: 2022-10-15 | Disposition: A | Discharge status: Home / Self Care | Payer: BC Managed Care – PPO | Source: Ambulatory Visit | Attending: Obstetrics and Gynecology | Admitting: Obstetrics and Gynecology

## 2022-10-15 DIAGNOSIS — Z98891 History of uterine scar from previous surgery: Secondary | ICD-10-CM

## 2022-10-15 DIAGNOSIS — O9832 Other infections with a predominantly sexual mode of transmission complicating childbirth: Secondary | ICD-10-CM | POA: Diagnosis present

## 2022-10-15 DIAGNOSIS — Z3A40 40 weeks gestation of pregnancy: Secondary | ICD-10-CM

## 2022-10-15 DIAGNOSIS — O163 Unspecified maternal hypertension, third trimester: Secondary | ICD-10-CM | POA: Diagnosis present

## 2022-10-15 DIAGNOSIS — Z3A00 Weeks of gestation of pregnancy not specified: Secondary | ICD-10-CM | POA: Insufficient documentation

## 2022-10-15 DIAGNOSIS — Z01812 Encounter for preprocedural laboratory examination: Secondary | ICD-10-CM | POA: Insufficient documentation

## 2022-10-15 DIAGNOSIS — O99824 Streptococcus B carrier state complicating childbirth: Secondary | ICD-10-CM | POA: Diagnosis present

## 2022-10-15 DIAGNOSIS — O0993 Supervision of high risk pregnancy, unspecified, third trimester: Principal | ICD-10-CM

## 2022-10-15 DIAGNOSIS — O3660X0 Maternal care for excessive fetal growth, unspecified trimester, not applicable or unspecified: Secondary | ICD-10-CM

## 2022-10-15 DIAGNOSIS — O99214 Obesity complicating childbirth: Secondary | ICD-10-CM | POA: Diagnosis present

## 2022-10-15 DIAGNOSIS — O403XX0 Polyhydramnios, third trimester, not applicable or unspecified: Secondary | ICD-10-CM

## 2022-10-15 DIAGNOSIS — O99210 Obesity complicating pregnancy, unspecified trimester: Secondary | ICD-10-CM

## 2022-10-15 DIAGNOSIS — O3663X0 Maternal care for excessive fetal growth, third trimester, not applicable or unspecified: Secondary | ICD-10-CM | POA: Diagnosis present

## 2022-10-15 DIAGNOSIS — O134 Gestational [pregnancy-induced] hypertension without significant proteinuria, complicating childbirth: Principal | ICD-10-CM | POA: Diagnosis present

## 2022-10-15 DIAGNOSIS — O133 Gestational [pregnancy-induced] hypertension without significant proteinuria, third trimester: Secondary | ICD-10-CM | POA: Diagnosis present

## 2022-10-15 DIAGNOSIS — Z3A39 39 weeks gestation of pregnancy: Secondary | ICD-10-CM

## 2022-10-15 DIAGNOSIS — A6000 Herpesviral infection of urogenital system, unspecified: Secondary | ICD-10-CM | POA: Diagnosis present

## 2022-10-15 LAB — TYPE AND SCREEN
ABO/RH(D): A POS
Antibody Screen: NEGATIVE
Extend sample reason: UNDETERMINED

## 2022-10-15 LAB — COMPREHENSIVE METABOLIC PANEL
ALT: 15 U/L (ref 0–44)
AST: 19 U/L (ref 15–41)
Albumin: 2.7 g/dL — ABNORMAL LOW (ref 3.5–5.0)
Alkaline Phosphatase: 131 U/L — ABNORMAL HIGH (ref 38–126)
Anion gap: 7 (ref 5–15)
BUN: 11 mg/dL (ref 6–20)
CO2: 21 mmol/L — ABNORMAL LOW (ref 22–32)
Calcium: 8.8 mg/dL — ABNORMAL LOW (ref 8.9–10.3)
Chloride: 107 mmol/L (ref 98–111)
Creatinine, Ser: 0.66 mg/dL (ref 0.44–1.00)
GFR, Estimated: >60 mL/min (ref >60)
Glucose, Bld: 75 mg/dL (ref 70–99)
Potassium: 4.3 mmol/L (ref 3.5–5.1)
Sodium: 135 mmol/L (ref 135–145)
Total Bilirubin: 0.5 mg/dL (ref 0.3–1.2)
Total Protein: 6.4 g/dL — ABNORMAL LOW (ref 6.5–8.1)

## 2022-10-15 LAB — CBC
HCT: 39.7 % (ref 36.0–46.0)
HCT: 41.1 % (ref 36.0–46.0)
Hemoglobin: 13.1 g/dL (ref 12.0–15.0)
Hemoglobin: 13.5 g/dL (ref 12.0–15.0)
MCH: 29.4 pg (ref 26.0–34.0)
MCH: 29.2 pg (ref 26.0–34.0)
MCHC: 33 g/dL (ref 30.0–36.0)
MCHC: 32.8 g/dL (ref 30.0–36.0)
MCV: 89 fL (ref 80.0–100.0)
MCV: 89 fL (ref 80.0–100.0)
Platelets: 236 10*3/uL (ref 150–400)
Platelets: 247 10*3/uL (ref 150–400)
RBC: 4.46 MIL/uL (ref 3.87–5.11)
RBC: 4.62 MIL/uL (ref 3.87–5.11)
RDW: 15.2 % (ref 11.5–15.5)
RDW: 15.3 % (ref 11.5–15.5)
WBC: 9.1 10*3/uL (ref 4.0–10.5)
WBC: 10.8 10*3/uL — ABNORMAL HIGH (ref 4.0–10.5)
nRBC: 0 % (ref 0.0–0.2)
nRBC: 0 % (ref 0.0–0.2)

## 2022-10-15 LAB — PROTEIN / CREATININE RATIO, URINE
Creatinine, Urine: 53 mg/dL
Protein Creatinine Ratio: 0.23 mg/mg{Cre} — ABNORMAL HIGH (ref 0.00–0.15)
Total Protein, Urine: 12 mg/dL

## 2022-10-15 LAB — RAPID HIV SCREEN (HIV 1/2 AB+AG)
HIV 1/2 Antibodies: NONREACTIVE
HIV-1 P24 Antigen - HIV24: NONREACTIVE

## 2022-10-15 MED ORDER — SOD CITRATE-CITRIC ACID 500-334 MG/5ML PO SOLN: 30.0000 mL | ORAL | Status: DC

## 2022-10-15 MED ORDER — CHLORHEXIDINE GLUCONATE 0.12 % MT SOLN: 15.0000 mL | Freq: Once | OROMUCOSAL | Status: DC

## 2022-10-15 MED ORDER — HYDRALAZINE HCL 20 MG/ML IJ SOLN: 10.0000 mg | INTRAMUSCULAR | Status: DC | PRN

## 2022-10-15 MED ORDER — ORAL CARE MOUTH RINSE: 15.0000 mL | Freq: Once | OROMUCOSAL | Status: DC

## 2022-10-15 MED ORDER — LABETALOL HCL 5 MG/ML IV SOLN: 20.0000 mg | INTRAVENOUS | Status: DC | PRN

## 2022-10-15 MED ORDER — LACTATED RINGERS IV SOLN: INTRAVENOUS | Status: DC

## 2022-10-15 MED ORDER — LABETALOL HCL 5 MG/ML IV SOLN: 80.0000 mg | INTRAVENOUS | Status: DC | PRN

## 2022-10-15 MED ORDER — CEFAZOLIN IN SODIUM CHLORIDE 3-0.9 GM/100ML-% IV SOLN
3.0000 g | INTRAVENOUS | Status: DC
  Filled 2022-10-15: qty 100

## 2022-10-15 MED ORDER — LABETALOL HCL 5 MG/ML IV SOLN: 40.0000 mg | INTRAVENOUS | Status: DC | PRN

## 2022-10-15 NOTE — Discharge Summary (Incomplete)
Lindsey Thompson is a 27 y.o. female. She is at Unknown gestation. Patient's last menstrual period was 01/10/2022 (approximate). Estimated Date of Delivery: None noted.  Prenatal care site: [facility]  Chief complaint: elevated bp in office today  HPI: Lindsey Thompson presents to L&D with complaints of elevated bp in office  Factors complicating pregnancy: Obesity Macrosomia Heart murmur Family history of heart defect GBS positive HSV 1-takes valtrex anxiety  S: Resting comfortably. no CTX, no VB.no LOF,  Active fetal movement.   Maternal Medical History:  Past Medical Hx:  has a past medical history of Asthma and Heart murmur.    Past Surgical Hx:  has a past surgical history that includes Tympanoplasty and Wisdom tooth extraction (Bilateral, 2014).   No Known Allergies   Prior to Admission medications   Medication Sig Start Date End Date Taking? Authorizing Provider  COLLAGEN PO Take by mouth. Collagen and Biotin   Yes [provider]  Docosahexaenoic Acid (DHA PO) Take by mouth.   Yes [provider]  escitalopram (LEXAPRO) 10 MG tablet Take 10 mg by mouth daily.   Yes [provider]  Ferrous Fumarate (IRON) 18 MG TBCR Take by mouth.   Yes [provider]  Folic Acid (FOLATE PO) Take 600 mcg by mouth.   Yes [provider]  Magnesium 250 MG TABS Take by mouth.   Yes [provider]  Prenatal MV-Min-Fe Fum-FA-DHA (PRENATAL 1 PO) Take by mouth.   Yes [provider]  valACYclovir (VALTREX) 1000 MG tablet Take 1,000 mg by mouth 2 (two) times daily.   Yes [provider]  HYDROcodone-acetaminophen (NORCO) 5-325 MG tablet Take 1 tablet by mouth every 6 (six) hours as needed for moderate pain. Patient not taking: Reported on 10/12/2022 05/24/17   Paulette Blanch, MD  HYDROcodone-acetaminophen [facility]) 5-325 MG tablet Take 1 tablet by mouth every 4 (four) hours as needed for moderate pain. Patient not taking:  Reported on 10/15/2022 11/27/17   Merlyn Lot, MD  ibuprofen (ADVIL,MOTRIN) 800 MG tablet Take 1 tablet (800 mg total) by mouth every 8 (eight) hours as needed for moderate pain. Patient not taking: Reported on 10/15/2022 05/24/17   Paulette Blanch, MD  OVER THE COUNTER MEDICATION Prenatal probiotioc    [provider]  OVER THE COUNTER MEDICATION 750 mg. Total Monolaurin    [provider]  prochlorperazine (COMPAZINE) 10 MG tablet Take 1 tablet (10 mg total) by mouth every 6 (six) hours as needed for nausea or vomiting. Patient not taking: Reported on 10/12/2022 11/27/17   Merlyn Lot, MD    Social History: She  reports that she has never smoked. She has never used smokeless tobacco. She reports that she does not drink alcohol and does not use drugs.  Family History: family history is not on file. ,no history of gyn cancers  Review of Systems: A full review of systems was performed and negative except as noted in the HPI.    O:  BP (!) 157/88   Pulse 93   Temp 98.4 F (36.9 C) (Oral)   Resp 16   Ht 5\' 7"  (1.702 m)   Wt (!) 153.8 kg   LMP 01/10/2022 (Approximate)   BMI 53.09 kg/m  Results for orders placed or performed during the hospital encounter of 10/15/22 (from the past 48 hour(s))  CBC   Collection Time: 10/15/22  3:01 PM  Result Value Ref Range   WBC 9.1 4.0 - 10.5 K/uL   RBC  4.46 3.87 - 5.11 MIL/uL   Hemoglobin 13.1 12.0 - 15.0 g/dL   HCT 39.7 36.0 - 46.0 %   MCV 89.0 80.0 - 100.0 fL   MCH 29.4 26.0 - 34.0 pg   MCHC 33.0 30.0 - 36.0 g/dL   RDW 15.2 11.5 - 15.5 %   Platelets 236 150 - 400 K/uL   nRBC 0.0 0.0 - 0.2 %  Rapid HIV screen (HIV 1/2 Ab+Ag)   Collection Time: 10/15/22  3:01 PM  Result Value Ref Range   HIV-1 P24 Antigen - HIV24 NON REACTIVE NON REACTIVE   HIV 1/2 Antibodies NON REACTIVE NON REACTIVE   Interpretation (HIV Ag Ab)      A non reactive test result means that HIV 1 or HIV 2 antibodies and HIV 1 p24 antigen were not detected  in the specimen.  Type and screen [place]   Collection Time: 10/15/22  3:01 PM  Result Value Ref Range   ABO/RH(D) A POS    Antibody Screen NEG    Sample Expiration 10/18/2022,2359    Extend sample reason      PREGNANT WITHIN 3 MONTHS, UNABLE TO EXTEND Performed at [facility], [address] [place], [place] [address]      Constitutional: NAD, AAOx3  HE/ENT: extraocular movements grossly intact, moist mucous membranes CV: RRR PULM: nl respiratory effort, CTABL Abd: gravid, non-tender, non-distended, soft  Ext: Non-tender, Nonedmeatous Psych: mood appropriate, speech normal Pelvic : deferred SVE:     Fetal Monitor: Baseline: *** bpm Variability: {fhr variability:21617::"moderate"} Accels: {Accelerations:21618::"Present"} Decels: {obgyn decelerations:310437:x:"none"} Toco: {obgyn contractions reg/irreg:312982}  Category: {[place] # I-V:19040::"I"}   Assessment: 27 y.o. Unknown here for antenatal surveillance during pregnancy.  Principle diagnosis:  There were no encounter diagnoses.   Plan: Labor: not present.  Fetal Wellbeing: Reassuring Cat 1 tracing. Reactive NST  D/c home stable, precautions reviewed, follow-up as scheduled.   ----- Avelino Leeds, CNM Certified Nurse Midwife [facility]

## 2022-10-16 ENCOUNTER — Other Ambulatory Visit: Payer: Self-pay

## 2022-10-16 ENCOUNTER — Encounter: Payer: Self-pay | Admitting: Obstetrics and Gynecology

## 2022-10-16 ENCOUNTER — Inpatient Hospital Stay: Payer: BC Managed Care – PPO | Admitting: Certified Registered Nurse Anesthetist

## 2022-10-16 ENCOUNTER — Encounter
Admission: EM | Disposition: A | Discharge status: Home / Self Care | Payer: Self-pay | Source: Home / Self Care | Attending: Obstetrics and Gynecology

## 2022-10-16 ENCOUNTER — Inpatient Hospital Stay
Admission: RE | Admit: 2022-10-16 | Payer: Managed Care, Other (non HMO) | Source: Home / Self Care | Admitting: Obstetrics and Gynecology

## 2022-10-16 DIAGNOSIS — O3660X0 Maternal care for excessive fetal growth, unspecified trimester, not applicable or unspecified: Secondary | ICD-10-CM

## 2022-10-16 DIAGNOSIS — O99824 Streptococcus B carrier state complicating childbirth: Secondary | ICD-10-CM | POA: Diagnosis present

## 2022-10-16 DIAGNOSIS — O403XX0 Polyhydramnios, third trimester, not applicable or unspecified: Secondary | ICD-10-CM | POA: Diagnosis present

## 2022-10-16 DIAGNOSIS — O99210 Obesity complicating pregnancy, unspecified trimester: Secondary | ICD-10-CM

## 2022-10-16 DIAGNOSIS — O134 Gestational [pregnancy-induced] hypertension without significant proteinuria, complicating childbirth: Secondary | ICD-10-CM | POA: Diagnosis present

## 2022-10-16 DIAGNOSIS — Z3A39 39 weeks gestation of pregnancy: Secondary | ICD-10-CM | POA: Diagnosis not present

## 2022-10-16 DIAGNOSIS — Z3A40 40 weeks gestation of pregnancy: Secondary | ICD-10-CM

## 2022-10-16 DIAGNOSIS — O99214 Obesity complicating childbirth: Secondary | ICD-10-CM | POA: Diagnosis present

## 2022-10-16 DIAGNOSIS — R030 Elevated blood-pressure reading, without diagnosis of hypertension: Secondary | ICD-10-CM | POA: Diagnosis present

## 2022-10-16 DIAGNOSIS — O3663X0 Maternal care for excessive fetal growth, third trimester, not applicable or unspecified: Secondary | ICD-10-CM | POA: Diagnosis present

## 2022-10-16 DIAGNOSIS — A6000 Herpesviral infection of urogenital system, unspecified: Secondary | ICD-10-CM | POA: Diagnosis present

## 2022-10-16 DIAGNOSIS — O9832 Other infections with a predominantly sexual mode of transmission complicating childbirth: Secondary | ICD-10-CM | POA: Diagnosis present

## 2022-10-16 LAB — ABO/RH: ABO/RH(D): A POS

## 2022-10-16 LAB — RPR: RPR Ser Ql: NONREACTIVE

## 2022-10-16 SURGERY — Surgical Case
Anesthesia: Spinal

## 2022-10-16 MED ORDER — CEFAZOLIN IN SODIUM CHLORIDE 3-0.9 GM/100ML-% IV SOLN
3.0000 g | INTRAVENOUS | Status: AC
  Administered 2022-10-16: 3 g via INTRAVENOUS
  Filled 2022-10-16: qty 100

## 2022-10-16 MED ORDER — DIPHENHYDRAMINE HCL 25 MG PO CAPS
25.0000 mg | ORAL_CAPSULE | Freq: Four times a day (QID) | ORAL | Status: DC | PRN
  Administered 2022-10-17: 25 mg via ORAL
  Filled 2022-10-16: qty 1

## 2022-10-16 MED ORDER — LIDOCAINE HCL (PF) 1 % IJ SOLN
INTRAMUSCULAR | Status: DC | PRN
  Administered 2022-10-16: 3 mL

## 2022-10-16 MED ORDER — SOD CITRATE-CITRIC ACID 500-334 MG/5ML PO SOLN
30.0000 mL | Freq: Once | ORAL | Status: AC
  Administered 2022-10-16: 30 mL via ORAL

## 2022-10-16 MED ORDER — SENNOSIDES-DOCUSATE SODIUM 8.6-50 MG PO TABS
2.0000 | ORAL_TABLET | ORAL | Status: DC
  Administered 2022-10-16 – 2022-10-17 (×2): 2 via ORAL
  Filled 2022-10-16 (×2): qty 2

## 2022-10-16 MED ORDER — OXYCODONE-ACETAMINOPHEN 5-325 MG PO TABS
2.0000 | ORAL_TABLET | ORAL | Status: DC | PRN
  Administered 2022-10-17 – 2022-10-18 (×3): 2 via ORAL
  Filled 2022-10-16 (×3): qty 2

## 2022-10-16 MED ORDER — VARICELLA VIRUS VACCINE LIVE 1350 PFU/0.5ML IJ SUSR: 0.5000 mL | INTRAMUSCULAR | Status: DC | PRN

## 2022-10-16 MED ORDER — OXYCODONE HCL 5 MG PO TABS
5.0000 mg | ORAL_TABLET | Freq: Four times a day (QID) | ORAL | Status: DC | PRN
  Administered 2022-10-17: 5 mg via ORAL
  Filled 2022-10-16: qty 1

## 2022-10-16 MED ORDER — SOD CITRATE-CITRIC ACID 500-334 MG/5ML PO SOLN
ORAL | Status: AC
  Filled 2022-10-16: qty 15

## 2022-10-16 MED ORDER — DIPHENHYDRAMINE HCL 25 MG PO CAPS: 25.0000 mg | ORAL_CAPSULE | ORAL | Status: DC | PRN

## 2022-10-16 MED ORDER — ACETAMINOPHEN 500 MG PO TABS
1000.0000 mg | ORAL_TABLET | Freq: Four times a day (QID) | ORAL | Status: DC | PRN
  Administered 2022-10-16 – 2022-10-17 (×3): 1000 mg via ORAL
  Filled 2022-10-16 (×2): qty 2

## 2022-10-16 MED ORDER — ACETAMINOPHEN 500 MG PO TABS
ORAL_TABLET | ORAL | Status: AC
  Filled 2022-10-16: qty 2

## 2022-10-16 MED ORDER — OXYCODONE-ACETAMINOPHEN 5-325 MG PO TABS: 1.0000 | ORAL_TABLET | ORAL | Status: DC | PRN

## 2022-10-16 MED ORDER — MORPHINE SULFATE (PF) 0.5 MG/ML IJ SOLN
INTRAMUSCULAR | Status: AC
  Filled 2022-10-16: qty 10

## 2022-10-16 MED ORDER — WITCH HAZEL-GLYCERIN EX PADS: 1.0000 | MEDICATED_PAD | CUTANEOUS | Status: DC | PRN

## 2022-10-16 MED ORDER — IBUPROFEN 600 MG PO TABS
600.0000 mg | ORAL_TABLET | Freq: Four times a day (QID) | ORAL | Status: DC
  Administered 2022-10-17 – 2022-10-18 (×5): 600 mg via ORAL
  Filled 2022-10-16 (×5): qty 1

## 2022-10-16 MED ORDER — OXYTOCIN-SODIUM CHLORIDE 30-0.9 UT/500ML-% IV SOLN
2.5000 [IU]/h | INTRAVENOUS | Status: AC
  Administered 2022-10-16: 2.5 [IU]/h via INTRAVENOUS

## 2022-10-16 MED ORDER — DIPHENHYDRAMINE HCL 50 MG/ML IJ SOLN
12.5000 mg | INTRAMUSCULAR | Status: DC | PRN
  Administered 2022-10-16: 12.5 mg via INTRAVENOUS
  Filled 2022-10-16 (×2): qty 1

## 2022-10-16 MED ORDER — SIMETHICONE 80 MG PO CHEW
80.0000 mg | CHEWABLE_TABLET | Freq: Three times a day (TID) | ORAL | Status: DC
  Administered 2022-10-16 – 2022-10-18 (×6): 80 mg via ORAL
  Filled 2022-10-16 (×6): qty 1

## 2022-10-16 MED ORDER — MENTHOL 3 MG MT LOZG: 1.0000 | LOZENGE | OROMUCOSAL | Status: DC | PRN

## 2022-10-16 MED ORDER — METHYLERGONOVINE MALEATE 0.2 MG/ML IJ SOLN
INTRAMUSCULAR | Status: AC
  Filled 2022-10-16: qty 1

## 2022-10-16 MED ORDER — KETOROLAC TROMETHAMINE 30 MG/ML IJ SOLN
30.0000 mg | Freq: Four times a day (QID) | INTRAMUSCULAR | Status: AC | PRN
  Administered 2022-10-16 – 2022-10-17 (×2): 30 mg via INTRAVENOUS
  Filled 2022-10-16 (×2): qty 1

## 2022-10-16 MED ORDER — PHENYLEPHRINE HCL (PRESSORS) 10 MG/ML IV SOLN
INTRAVENOUS | Status: AC
  Filled 2022-10-16: qty 1

## 2022-10-16 MED ORDER — FERROUS SULFATE 325 (65 FE) MG PO TABS
325.0000 mg | ORAL_TABLET | Freq: Two times a day (BID) | ORAL | Status: DC
  Administered 2022-10-16 – 2022-10-18 (×4): 325 mg via ORAL
  Filled 2022-10-16 (×4): qty 1

## 2022-10-16 MED ORDER — MORPHINE SULFATE (PF) 0.5 MG/ML IJ SOLN
INTRAMUSCULAR | Status: DC | PRN
  Administered 2022-10-16: 100 ug via INTRATHECAL

## 2022-10-16 MED ORDER — PHENYLEPHRINE HCL-NACL 20-0.9 MG/250ML-% IV SOLN
INTRAVENOUS | Status: DC | PRN
  Administered 2022-10-16: 40 ug/min via INTRAVENOUS

## 2022-10-16 MED ORDER — DIBUCAINE (PERIANAL) 1 % EX OINT: 1.0000 | TOPICAL_OINTMENT | CUTANEOUS | Status: DC | PRN

## 2022-10-16 MED ORDER — TRANEXAMIC ACID-NACL 1000-0.7 MG/100ML-% IV SOLN
INTRAVENOUS | Status: AC
  Filled 2022-10-16: qty 100

## 2022-10-16 MED ORDER — ONDANSETRON HCL 4 MG/2ML IJ SOLN
INTRAMUSCULAR | Status: DC | PRN
  Administered 2022-10-16: 4 mg via INTRAVENOUS

## 2022-10-16 MED ORDER — GLYCOPYRROLATE 0.2 MG/ML IJ SOLN
INTRAMUSCULAR | Status: DC | PRN
  Administered 2022-10-16: .2 mg via INTRAVENOUS

## 2022-10-16 MED ORDER — SODIUM CHLORIDE 0.9% FLUSH: 3.0000 mL | INTRAVENOUS | Status: DC | PRN

## 2022-10-16 MED ORDER — FENTANYL CITRATE (PF) 100 MCG/2ML IJ SOLN: 25.0000 ug | INTRAMUSCULAR | Status: DC | PRN

## 2022-10-16 MED ORDER — CHLORHEXIDINE GLUCONATE 0.12 % MT SOLN
15.0000 mL | Freq: Once | OROMUCOSAL | Status: AC
  Administered 2022-10-16: 15 mL via OROMUCOSAL
  Filled 2022-10-16: qty 15

## 2022-10-16 MED ORDER — KETOROLAC TROMETHAMINE 30 MG/ML IJ SOLN
INTRAMUSCULAR | Status: DC | PRN
  Administered 2022-10-16: 30 mg via INTRAVENOUS

## 2022-10-16 MED ORDER — PRENATAL MULTIVITAMIN CH
1.0000 | ORAL_TABLET | Freq: Every day | ORAL | Status: DC
  Administered 2022-10-17 – 2022-10-18 (×2): 1 via ORAL
  Filled 2022-10-16 (×2): qty 1

## 2022-10-16 MED ORDER — LACTATED RINGERS IV SOLN: INTRAVENOUS | Status: DC

## 2022-10-16 MED ORDER — BUPIVACAINE IN DEXTROSE 0.75-8.25 % IT SOLN
INTRATHECAL | Status: DC | PRN
  Administered 2022-10-16: 1.6 mL via INTRATHECAL

## 2022-10-16 MED ORDER — ESCITALOPRAM OXALATE 10 MG PO TABS
10.0000 mg | ORAL_TABLET | Freq: Every day | ORAL | Status: DC
  Administered 2022-10-16 – 2022-10-18 (×3): 10 mg via ORAL
  Filled 2022-10-16 (×3): qty 1

## 2022-10-16 MED ORDER — FENTANYL CITRATE (PF) 100 MCG/2ML IJ SOLN
INTRAMUSCULAR | Status: AC
  Filled 2022-10-16: qty 2

## 2022-10-16 MED ORDER — NALOXONE HCL 0.4 MG/ML IJ SOLN: 0.4000 mg | INTRAMUSCULAR | Status: DC | PRN

## 2022-10-16 MED ORDER — OXYTOCIN-SODIUM CHLORIDE 30-0.9 UT/500ML-% IV SOLN
INTRAVENOUS | Status: DC | PRN
  Administered 2022-10-16: 500 mL via INTRAVENOUS

## 2022-10-16 MED ORDER — PHENYLEPHRINE HCL (PRESSORS) 10 MG/ML IV SOLN
INTRAVENOUS | Status: DC | PRN
  Administered 2022-10-16 (×2): 80 ug via INTRAVENOUS

## 2022-10-16 MED ORDER — PROMETHAZINE HCL 25 MG/ML IJ SOLN: 6.2500 mg | INTRAMUSCULAR | Status: DC | PRN

## 2022-10-16 MED ORDER — ENOXAPARIN SODIUM 40 MG/0.4ML IJ SOSY
40.0000 mg | PREFILLED_SYRINGE | INTRAMUSCULAR | Status: DC
  Filled 2022-10-16: qty 0.4

## 2022-10-16 MED ORDER — OXYTOCIN-SODIUM CHLORIDE 30-0.9 UT/500ML-% IV SOLN
INTRAVENOUS | Status: AC
  Filled 2022-10-16: qty 1000

## 2022-10-16 MED ORDER — ENOXAPARIN SODIUM 80 MG/0.8ML IJ SOSY
75.0000 mg | PREFILLED_SYRINGE | INTRAMUSCULAR | Status: DC
  Administered 2022-10-16 – 2022-10-17 (×2): 75 mg via SUBCUTANEOUS
  Filled 2022-10-16 (×2): qty 0.75

## 2022-10-16 MED ORDER — NALOXONE HCL 4 MG/10ML IJ SOLN: 1.0000 ug/kg/h | INTRAVENOUS | Status: DC | PRN

## 2022-10-16 MED ORDER — ONDANSETRON HCL 4 MG/2ML IJ SOLN: 4.0000 mg | Freq: Three times a day (TID) | INTRAMUSCULAR | Status: DC | PRN

## 2022-10-16 MED ORDER — MEPERIDINE HCL 25 MG/ML IJ SOLN: 6.2500 mg | INTRAMUSCULAR | Status: DC | PRN

## 2022-10-16 MED ORDER — FENTANYL CITRATE (PF) 100 MCG/2ML IJ SOLN
INTRAMUSCULAR | Status: DC | PRN
  Administered 2022-10-16: 15 ug via INTRATHECAL

## 2022-10-16 MED ORDER — COCONUT OIL OIL
1.0000 | TOPICAL_OIL | Status: DC | PRN
  Filled 2022-10-16: qty 15

## 2022-10-16 MED ORDER — KETOROLAC TROMETHAMINE 30 MG/ML IJ SOLN: 30.0000 mg | Freq: Four times a day (QID) | INTRAMUSCULAR | Status: AC | PRN

## 2022-10-16 SURGICAL SUPPLY — 37 items
ADH SKN CLS APL DERMABOND .7 (GAUZE/BANDAGES/DRESSINGS) ×1
CATH KIT ON-Q SILVERSOAK 5 (CATHETERS) ×2 IMPLANT
CATH KIT ON-Q SILVERSOAK 5IN (CATHETERS) ×2 IMPLANT
DERMABOND ADVANCED .7 DNX12 (GAUZE/BANDAGES/DRESSINGS) ×1 IMPLANT
DRSG OPSITE POSTOP 4X10 (GAUZE/BANDAGES/DRESSINGS) ×1 IMPLANT
DRSG TELFA 3X8 NADH STRL (GAUZE/BANDAGES/DRESSINGS) ×1 IMPLANT
ELECT CAUTERY BLADE 6.4 (BLADE) ×1 IMPLANT
ELECT REM PT RETURN 9FT ADLT (ELECTROSURGICAL) ×1 IMPLANT
ELECTRODE REM PT RTRN 9FT ADLT (ELECTROSURGICAL) ×1 IMPLANT
GAUZE SPONGE 4X4 12PLY STRL (GAUZE/BANDAGES/DRESSINGS) ×1 IMPLANT
GLOVE BIO SURGEON STRL SZ7 (GLOVE) ×1 IMPLANT
GLOVE INDICATOR 7.5 STRL GRN (GLOVE) ×1 IMPLANT
GOWN STRL REUS W/ TWL LRG LVL3 (GOWN DISPOSABLE) ×3 IMPLANT
GOWN STRL REUS W/TWL LRG LVL3 (GOWN DISPOSABLE) ×3
KIT PREVENA INCISION MGT20CM45 (CANNISTER) IMPLANT
MANIFOLD NEPTUNE II (INSTRUMENTS) ×1 IMPLANT
MAT PREVALON FULL STRYKER (MISCELLANEOUS) ×1 IMPLANT
NS IRRIG 1000ML POUR BTL (IV SOLUTION) ×1 IMPLANT
PACK C SECTION AR (MISCELLANEOUS) ×1 IMPLANT
PAD OB MATERNITY 4.3X12.25 (PERSONAL CARE ITEMS) ×2 IMPLANT
PAD PREP 24X41 OB/GYN DISP (PERSONAL CARE ITEMS) ×1 IMPLANT
RETRACTOR WND ALEXIS-O 25 LRG (MISCELLANEOUS) IMPLANT
RTRCTR WOUND ALEXIS O 25CM LRG (MISCELLANEOUS) ×1 IMPLANT
SCRUB CHG 4% DYNA-HEX 4OZ (MISCELLANEOUS) ×1 IMPLANT
STRIP CLOSURE SKIN 1/2X4 (GAUZE/BANDAGES/DRESSINGS) ×1 IMPLANT
SUT 2-0 PL GUT LIGAPAK (SUTURE) IMPLANT
SUT MNCRL 4-0 (SUTURE) ×1
SUT MNCRL 4-0 27 PS-2 XMFL (SUTURE) ×1 IMPLANT
SUT MNCRL 4-0 27XMFL (SUTURE) ×1
SUT PDS AB 1 TP1 96 (SUTURE) ×1 IMPLANT
SUT PLAIN GUT 0 (SUTURE) IMPLANT
SUT VIC AB 0 CTX 36 (SUTURE) ×2
SUT VIC AB 0 CTX36XBRD ANBCTRL (SUTURE) ×2 IMPLANT
SUTURE MNCRL 4-0 27XMF (SUTURE) ×1 IMPLANT
SWABSTK COMLB BENZOIN TINCTURE (MISCELLANEOUS) ×1 IMPLANT
TRAP FLUID SMOKE EVACUATOR (MISCELLANEOUS) ×1 IMPLANT
WATER STERILE IRR 500ML POUR (IV SOLUTION) ×1 IMPLANT

## 2022-10-16 NOTE — Transfer of Care (Signed)
Immediate Anesthesia Transfer of Care Note  Patient: Lindsey Thompson  Procedure(s) Performed: CESAREAN SECTION  Patient Location: Mother/Baby  Anesthesia Type:Spinal  Level of Consciousness: awake, alert , and oriented  Airway & Oxygen Therapy: Patient Spontanous Breathing  Post-op Assessment: Report given to RN and Post -op Vital signs reviewed and stable  Post vital signs: Reviewed and stable  Last Vitals:  Vitals Value Taken Time  BP 128/67   Temp    Pulse 81   Resp 14   SpO2 100     Last Pain:  Vitals:   10/16/22 0744  TempSrc: Oral  PainSc:          Complications: No notable events documented.

## 2022-10-16 NOTE — Lactation Note (Signed)
This note was copied from a baby's chart. Lactation Consultation Note  Patient Name: Boy Catlin Mckiver Today's Date: 10/16/2022 Age:29 hours Reason for consult: L&D Initial assessment;Primapara;1st time breastfeeding;Term;Other (Comment) (c-sectioni, Macrosomi (12lbs))   Maternal Data Has patient been taught Hand Expression?: Yes Does the patient have breastfeeding experience prior to this delivery?: No  P1, scheduled primary c-section due to suspected macrosomia. Baby delivered at [place]. Mom has hx of heart murmur and asthma. Desires to breastfeed  Feeding Mother's Current Feeding Choice: Breast Milk  LC at bedside in LDR5 to assist with first feeding. Baby calm and awake but starting to cue early. Support pillows placed and mom comfortable for football hold. Placed baby at breast with sandwich hold. Latched easily, sustained latch, strong rhythmic sucking pattern with audible and identifiable swallows. Baby at breast for 22 minutes with last 2 minutes on/off suckling before letting go of the breast. -Of note: throughout feeding baby was making noises "singing/whimpering".-Transition RN made aware. LC assisted with baby being skin to skin with mom on her chest in upright position, head turned to the side.  LATCH Score Latch: Grasps breast easily, tongue down, lips flanged, rhythmical sucking.  Audible Swallowing: Spontaneous and intermittent  Type of Nipple: Everted at rest and after stimulation  Comfort (Breast/Nipple): Soft / non-tender  Hold (Positioning): Assistance needed to correctly position infant at breast and maintain latch.  LATCH Score: 9   Lactation Tools Discussed/Used  Discussed with family possible need for early pump initiation due to baby's large size and possible low glucose outcomes. Mom open to pumping if needed- has her own pump at home.  Interventions Interventions: Breast feeding basics reviewed;Assisted with latch;Breast massage;Breast  compression;Adjust position;Support pillows;Education (Newborn feeding patterns/behaviors, glucose check-feeding plan TBD, early hunger cues, positioning/latching)  Discharge Pump: Personal  Consult Status Consult Status: Follow-up from L&D    Lavonia Drafts 10/16/2022, 1:38 PM

## 2022-10-16 NOTE — H&P (Addendum)
OB History & Physical   History of Present Illness:   Chief Complaint: elevated bp in office  HPI:  Lindsey Thompson is a 27 y.o. G1P0 female at [redacted]w[redacted]d, Patient's last menstrual period was 01/10/2022 (approximate)., consistent with [place] at [redacted]w[redacted]d, with Estimated Date of Delivery: 10/17/22.  She presents to L&D for elevated bp in third trimester  Reports active fetal movement  Contractions: irregular cramping  LOF/SROM: intact Vaginal bleeding: denies  Factors complicating pregnancy:  Obesity Macrosomia Heart murmur Family history of heart defect GBS positive HSV 1-takes valtrex Anxiety Activity induced asthma  Patient Active Problem List   Diagnosis Date Noted   Elevated blood pressure affecting pregnancy in third trimester, antepartum 10/15/2022   Supervision of high risk pregnancy in third trimester 03/05/2022    Prenatal Transfer Tool  Maternal Diabetes: No Genetic Screening: Normal Maternal Ultrasounds/Referrals: Normal Fetal Ultrasounds or other Referrals:  Fetal echo Maternal Substance Abuse:  No Significant Maternal Medications:  Meds include: Other: PRN Klonopin Significant Maternal Lab Results: Group B Strep positive  Maternal Medical History:   Past Medical History:  Diagnosis Date   Asthma    Heart murmur     Past Surgical History:  Procedure Laterality Date   TYMPANOPLASTY     WISDOM TOOTH EXTRACTION Bilateral 2014    No Known Allergies  Prior to Admission medications   Medication Sig Start Date End Date Taking? Authorizing Provider  COLLAGEN PO Take by mouth. Collagen and Biotin   Yes [provider]  Docosahexaenoic Acid (DHA PO) Take by mouth.   Yes [provider]  escitalopram (LEXAPRO) 10 MG tablet Take 10 mg by mouth daily.   Yes [provider]  Ferrous Fumarate (IRON) 18 MG TBCR Take by mouth.   Yes [provider]  Folic Acid (FOLATE PO) Take 600 mcg by mouth.   Yes [provider]  Magnesium  250 MG TABS Take by mouth.   Yes [provider]  Prenatal MV-Min-Fe Fum-FA-DHA (PRENATAL 1 PO) Take by mouth.   Yes [provider]  valACYclovir (VALTREX) 1000 MG tablet Take 1,000 mg by mouth 2 (two) times daily.   Yes [provider]  HYDROcodone-acetaminophen (NORCO) 5-325 MG tablet Take 1 tablet by mouth every 6 (six) hours as needed for moderate pain. Patient not taking: Reported on 10/12/2022 05/24/17   Paulette Blanch, MD  HYDROcodone-acetaminophen [facility]) 5-325 MG tablet Take 1 tablet by mouth every 4 (four) hours as needed for moderate pain. Patient not taking: Reported on 10/15/2022 11/27/17   Merlyn Lot, MD  ibuprofen (ADVIL,MOTRIN) 800 MG tablet Take 1 tablet (800 mg total) by mouth every 8 (eight) hours as needed for moderate pain. Patient not taking: Reported on 10/15/2022 05/24/17   Paulette Blanch, MD  OVER THE COUNTER MEDICATION Prenatal probiotioc    [provider]  OVER THE COUNTER MEDICATION 750 mg. Total Monolaurin    [provider]  prochlorperazine (COMPAZINE) 10 MG tablet Take 1 tablet (10 mg total) by mouth every 6 (six) hours as needed for nausea or vomiting. Patient not taking: Reported on 10/12/2022 11/27/17   Merlyn Lot, MD     Prenatal care site:  [facility]  OB History  Gravida Para Term Preterm AB Living  1 0 0 0 0 0  SAB IAB Ectopic Multiple Live Births  0 0 0 0 0    # Outcome Date GA Lbr Len/2nd Weight Sex Delivery Anes PTL Lv  1 Current  Social History: She  reports that she has never smoked. She has never used smokeless tobacco. She reports that she does not drink alcohol and does not use drugs.  Family History: family history is not on file.   Review of Systems: A full review of systems was performed and negative except as noted in the HPI.     Physical Exam:  Vital Signs: BP 133/68   Pulse (!) 121   Temp 98 F (36.7 C) (Oral)   Resp 16   Ht 5\' 7"  (1.702 m)   Wt (!)  153.8 kg   LMP 01/10/2022 (Approximate)   BMI 53.09 kg/m   General: no acute distress.  HEENT: normocephalic, atraumatic Heart: regular rate & rhythm Lungs: normal respiratory effort Abdomen: soft, gravid, non-tender;  EFW: 6000g Pelvic:   External: Normal external female genitalia  Cervix:   /   /      Extremities: non-tender, symmetric, no edema bilaterally.  DTRs: +2  Neurologic: Alert & oriented x 3.    Results for orders placed or performed during the hospital encounter of 10/15/22 (from the past 24 hour(s))  Protein / creatinine ratio, urine     Status: Abnormal   Collection Time: 10/15/22  6:33 PM  Result Value Ref Range   Creatinine, Urine 53 mg/dL   Total Protein, Urine 12 mg/dL   Protein Creatinine Ratio 0.23 (H) 0.00 - 0.15 mg/mg[Cre]  CBC     Status: Abnormal   Collection Time: 10/15/22  6:49 PM  Result Value Ref Range   WBC 10.8 (H) 4.0 - 10.5 K/uL   RBC 4.62 3.87 - 5.11 MIL/uL   Hemoglobin 13.5 12.0 - 15.0 g/dL   HCT 41.1 36.0 - 46.0 %   MCV 89.0 80.0 - 100.0 fL   MCH 29.2 26.0 - 34.0 pg   MCHC 32.8 30.0 - 36.0 g/dL   RDW 15.3 11.5 - 15.5 %   Platelets 247 150 - 400 K/uL   nRBC 0.0 0.0 - 0.2 %  Comprehensive metabolic panel     Status: Abnormal   Collection Time: 10/15/22  6:49 PM  Result Value Ref Range   Sodium 135 135 - 145 mmol/L   Potassium 4.3 3.5 - 5.1 mmol/L   Chloride 107 98 - 111 mmol/L   CO2 21 (L) 22 - 32 mmol/L   Glucose, Bld 75 70 - 99 mg/dL   BUN 11 6 - 20 mg/dL   Creatinine, Ser 0.66 0.44 - 1.00 mg/dL   Calcium 8.8 (L) 8.9 - 10.3 mg/dL   Total Protein 6.4 (L) 6.5 - 8.1 g/dL   Albumin 2.7 (L) 3.5 - 5.0 g/dL   AST 19 15 - 41 U/L   ALT 15 0 - 44 U/L   Alkaline Phosphatase 131 (H) 38 - 126 U/L   Total Bilirubin 0.5 0.3 - 1.2 mg/dL   GFR, Estimated >60 >60 mL/min   Anion gap 7 5 - 15    Pertinent Results:  Prenatal Labs: Blood type/Rh A POS   Antibody screen Negative    Rubella immune    Varicella Not immune  RPR NR    HBsAg  Neg   Hep C NR   HIV NON REACTIVE (03/25 1501)   GC neg  Chlamydia neg  Genetic screening cfDNA negative   1 hour GTT 110  3 hour GTT N/a  GBS Posiitve     FHT:  FHR: 125 bpm, variability: moderate,  accelerations:  Present,  decelerations:  Absent Category/reactivity:  Category I UC:   irregular cramping, patient denies feeling any discomfort   Cephalic by [place] and [place]   No results found.  Assessment:  Lindsey Thompson is a 27 y.o. G1P0 female at [redacted]w[redacted]d with Macrosomia, obesity scheduled for primary c-section.   Plan:  1. Admit to Labor & Delivery - consents reviewed and obtained - Dr. Fannie Knee notified of admission and plan of care   2. Fetal Well being  - Fetal Tracing: Category 1 - Group B Streptococcus ppx  indicated: GBS positive  3. Proceed with primary C-Section for Macrosomia, and obesity  4. Post Partum Planning: - Infant feeding: breast feeding - Contraception:  TBD - Tdap vaccine: Given prenatally - Flu vaccine:  declined  Peyton Spengler Marlaine Hind, CNM [id number]  Avelino Leeds, CNM Certified Nurse Midwife [facility]

## 2022-10-16 NOTE — Discharge Summary (Signed)
Postpartum Discharge Summary    Patient Name: Lindsey Thompson DOB: Jan 18, 1996 MRN: [id number]  Date of admission: 10/15/2022 Delivery date:10/16/2022  Delivering provider: Prentice Docker D  Date of discharge: 10/18/2022  Admitting diagnosis: Gestational hypertension affecting pregnancy, third trimester, Fetal macrosomia affecting management of mother, antepartum [O36.60X0] Intrauterine pregnancy: [redacted]w[redacted]d     Secondary diagnosis:  Principal Problem:   Status post cesarean delivery Active Problems:   Gestational hypertension without significant proteinuria during pregnancy in third trimester, antepartum  Additional problems: none    Discharge diagnosis: Term Pregnancy Delivered and Gestational Hypertension                                              Post partum procedures: none Augmentation: N/A Complications: None  Hospital course: Sceduled C/S   27 y.o. yo G1P1001 at [redacted]w[redacted]d was admitted to the hospital 10/15/2022 for scheduled cesarean section with the following indication:Macrosomia.Delivery details are as follows:  Membrane Rupture Time/Date: 11:32 AM ,10/16/2022   Delivery Method:C-Section, Low Transverse  Details of operation can be found in separate operative note.  Patient had a postpartum course complicated by none.  She is ambulating, tolerating a regular diet, passing flatus, and urinating well. Patient is discharged home in stable condition on  10/18/22        Newborn Data: Birth date:10/16/2022  Birth time:11:32 AM  Gender:Female  "[place]" Living status:Living  Apgars:8 ,9  Weight:5640 g     Magnesium Sulfate received: No BMZ received: No Rhophylac:N/A MMR:No T-DaP:Given prenatally Flu: N/A Transfusion:No  Physical exam  Vitals:   10/17/22 0840 10/17/22 1535 10/17/22 2314 10/18/22 0955  BP: 111/62 (!) 152/82 (!) 100/58 117/71  Pulse: 91 100 (!) 103 (!) 109  Resp: 18 18 18 18   Temp: 97.9 F (36.6 C) 98.4 F (36.9 C) 97.6 F (36.4 C) 97.7 F (36.5 C)   TempSrc: Oral Oral  Oral  SpO2: 95%  100% 97%  Weight:      Height:       General: alert, cooperative, and no distress Lochia: appropriate Uterine Fundus: firm Incision: Dressing is clean, dry, and intact, wound vac in place, working properly DVT Evaluation: No evidence of DVT seen on physical exam. Labs: Lab Results  Component Value Date   WBC 12.3 (H) 10/17/2022   HGB 10.2 (L) 10/17/2022   HCT 31.0 (L) 10/17/2022   MCV 89.6 10/17/2022   PLT 206 10/17/2022      Latest Ref Rng & Units 10/15/2022    6:49 PM  CMP  Glucose 70 - 99 mg/dL 75   BUN 6 - 20 mg/dL 11   Creatinine 0.44 - 1.00 mg/dL 0.66   Sodium 135 - 145 mmol/L 135   Potassium 3.5 - 5.1 mmol/L 4.3   Chloride 98 - 111 mmol/L 107   CO2 22 - 32 mmol/L 21   Calcium 8.9 - 10.3 mg/dL 8.8   Total Protein 6.5 - 8.1 g/dL 6.4   Total Bilirubin 0.3 - 1.2 mg/dL 0.5   Alkaline Phos 38 - 126 U/L 131   AST 15 - 41 U/L 19   ALT 0 - 44 U/L 15    Edinburgh Score:    10/17/2022   10:43 AM  Edinburgh Postnatal Depression Scale Screening Tool  I have been able to laugh and see the funny side of things. 0  I have looked forward with enjoyment to  things. 0  I have blamed myself unnecessarily when things went wrong. 1  I have been anxious or worried for no good reason. 2  I have felt scared or panicky for no good reason. 0  Things have been getting on top of me. 0  I have been so unhappy that I have had difficulty sleeping. 0  I have felt sad or miserable. 0  I have been so unhappy that I have been crying. 0  The thought of harming myself has occurred to me. 0  Edinburgh Postnatal Depression Scale Total 3      After visit meds:  Allergies as of 10/18/2022   No Known Allergies      Medication List     STOP taking these medications    HYDROcodone-acetaminophen 5-325 MG tablet Commonly known as: Norco   prochlorperazine 10 MG tablet Commonly known as: COMPAZINE       TAKE these medications    COLLAGEN  PO Take by mouth. Collagen and Biotin   DHA PO Take by mouth.   escitalopram 10 MG tablet Commonly known as: LEXAPRO Take 10 mg by mouth daily.   FOLATE PO Take 600 mcg by mouth.   ibuprofen 600 MG tablet Commonly known as: ADVIL Take 1 tablet (600 mg total) by mouth every 6 (six) hours as needed. What changed:  medication strength how much to take when to take this reasons to take this   Iron 18 MG Tbcr Take by mouth.   Magnesium 250 MG Tabs Take by mouth.   OVER THE COUNTER MEDICATION Prenatal probiotioc   OVER THE COUNTER MEDICATION 750 mg. Total Monolaurin   oxyCODONE-acetaminophen 5-325 MG tablet Commonly known as: PERCOCET/ROXICET Take 1 tablet by mouth every 4 (four) hours as needed for moderate pain or severe pain.   PRENATAL 1 PO Take by mouth.   senna-docusate 8.6-50 MG tablet Commonly known as: Senokot-S Take 2 tablets by mouth at bedtime as needed for mild constipation.   valACYclovir 1000 MG tablet Commonly known as: VALTREX Take 1,000 mg by mouth 2 (two) times daily.        Risk assessment for postpartum VTE and prophylactic treatment: Very high risk factors: If any risk factors: 6 weeks LMHW and BMI > 50 kg/m2 High risk factors: None Moderate risk factors: PPH > 1068ml and Cesarean delivery   Postpartum VTE prophylaxis with LMWH  is indicated  for 6 weeks.   Discharge home in stable condition Infant Feeding: Breast Infant Disposition:home with mother Discharge instruction: per After Visit Summary and Postpartum booklet. Activity: Advance as tolerated. Pelvic rest for 6 weeks.  Diet: routine diet Anticipated Birth Control: Unsure Postpartum Appointment:6 weeks Additional Postpartum F/U: Incision check 1 week Future Appointments:No future appointments. Follow up Visit:  Follow-up Information     Will Bonnet, MD. Go in 2 week(s).   Specialty: Obstetrics and Gynecology Why: For incision check, (Keep previously scheduled  appointment) Contact information: [place] [address] [phone]                SIGNED: Clydene Laming 10/18/2022 11:57 AM

## 2022-10-16 NOTE — Progress Notes (Signed)
Checked on patient at this time noted to be resting well with husband in room. BP cuff adjusted at this time.

## 2022-10-16 NOTE — Interval H&P Note (Signed)
History and Physical Interval Note:  10/16/2022 10:39 AM  Lindsey Thompson  has presented today for surgery, with the diagnosis of macrosomia.  The various methods of treatment have been discussed with the patient and family. After consideration of risks, benefits and other options for treatment, the patient has consented to  Procedure(s): CESAREAN SECTION (N/A) as a surgical intervention.  The patient's history has been reviewed, patient examined, no change in status, stable for surgery.  I have reviewed the patient's chart and labs.  Questions were answered to the patient's satisfaction.  She has been diagnosed also with gestational hypertension due to BPs >140/90, but less than the severe range of 160/110. She denies HAs, visual changes, RUQ pain. Today, she notes +FM, no LOF, no vaginal bleeding, and no contractions.  Her original H&P from this morning is otherwise unchanged. She has a suspected fetal macrosomia with EFW of 5,900 grams as of yesterday. She also has mild polyhydramnios with AFI of 26 cm.  Consents reviewed and signed today.  She wishes to proceed.   Prentice Docker, MD, [facility] 10/16/2022 10:41 AM

## 2022-10-16 NOTE — Op Note (Signed)
Cesarean Section Operative Note    Patient Name: Lindsey Thompson  Date of Birth: 1995-11-04  MRN: [id number]  Date of Surgery: 10/16/2022   Pre-operative Diagnosis:  1) Fetal macrosomia, EFW 5,912 grams on 10/15/2022 2) Polyhydramnios in pregnancy, third trimester 3) Morbid obesity with BMI 53 4) intrauterine pregnancy at [redacted]w[redacted]d gestational age  Post-operative Diagnosis:  1) Fetal macrosomia, actual fetal weight 5,640 grams (12 lb 7 oz) 2) Polyhydramnios in pregnancy, third trimester 3) Morbid obesity with BMI 53 4) intrauterine pregnancy at [redacted]w[redacted]d gestational age   Procedure: Primary Low Transverse Cesarean Delivery via Pfannenstiel incision with double-layer uterine closure  Surgeon: Surgeon(s) and Role:    Will Bonnet, MD - Primary  Assistants: Dr. Benjaman Kindler; No other capable assistant available, in surgery requiring high level assistant.  Anesthesia: spinal   Findings:  1) normal appearing gravid uterus, fallopian tubes, and ovaries 2) Large for gestational age infant at 5,640 grams with APGARs 8 and 9   Estimated Blood Loss: 1,200 mL Quantified Blood Loss: 1,975 mL  Total IV Fluids: 1,000 ml   Specimens: None  Complications: no complications  Disposition: PACU - hemodynamically stable.   Maternal Condition: stable   Baby condition / location:  Couplet care / Skin to Skin  Procedure Details:  The patient was seen in the Holding Room. The risks, benefits, complications, treatment options, and expected outcomes were discussed with the patient. The patient concurred with the proposed plan, giving informed consent. identified as [place] and the procedure verified as C-Section Delivery. A Time Out was held and the above information confirmed.   After induction of anesthesia, the patient was draped and prepped in the usual sterile manner. A Pfannenstiel incision was made and carried down through the subcutaneous tissue to the fascia. Fascial  incision was made and extended transversely. The fascia was separated from the underlying rectus tissue superiorly and inferiorly. The peritoneum was identified and entered. Peritoneal incision was extended longitudinally.  A large Alexis retractor was placed to aid with retraction and add visualization.  The bladder flap was not bluntly or sharply freed from the lower uterine segment. A low transverse uterine incision was made and the hysterotomy was extended with cranial-caudal tension. Delivered from cephalic presentation was a 5,640 gram Living newborn infant(s) or Female with Apgar scores of 8 at one minute and 9 at five minutes. Cord ph was not sent the umbilical cord was clamped and cut cord blood was not obtained for evaluation. The placenta was removed Intact and appeared normal. The uterine outline, tubes and ovaries appeared normal. The uterine incision was closed with running locked sutures of 0 Vicryl.  A second layer of the same suture was thrown in an imbricating fashion.  Hemostasis was assured.  The paracolic gutters were cleared of all clots and debris.  The rectus muscles were inspected and found to be hemostatic.  The fascia was then reapproximated with running sutures of 1-0 PDS, looped. The subcutaneous tissue was reapproximated using 2-0 plain gut such that no greater than 2cm of dead space remained. The subcuticular closure was performed using 4-0 monocryl. The Provena wound vac was applied in a sterile fashion and suction was noted to be intact without leakage.   The surgical assistant performed tissue retraction, assistance with suturing, and fundal pressure.  This was a complicated delivery involving a large for gestational infant and patient with BMI of 53.    Instrument, sponge, and needle counts were correct prior the abdominal closure and  were correct at the conclusion of the case.  The patient received Ancef 2 gram IV prior to skin incision (within 30 minutes). For VTE prophylaxis  she was wearing SCDs throughout the case.  The assistant surgeon was an MD due to lack of availability of another Counselling psychologist.    Signed: Will Bonnet, MD 10/16/2022 12:32 PM

## 2022-10-16 NOTE — Anesthesia Preprocedure Evaluation (Signed)
Anesthesia Evaluation  Patient identified by MRN, date of birth, ID band Patient awake    Reviewed: Allergy & Precautions, H&P , NPO status , Patient's Chart, lab work & pertinent test results, reviewed documented beta blocker date and time   History of Anesthesia Complications Negative for: history of anesthetic complications  Airway Mallampati: I  TM Distance: >3 FB Neck ROM: full    Dental  (+) Caps, Dental Advidsory Given, Teeth Intact   Pulmonary neg shortness of breath, asthma , neg sleep apnea, neg COPD, neg recent URI   Pulmonary exam normal breath sounds clear to auscultation       Cardiovascular Exercise Tolerance: Good (-) hypertension(-) angina (-) Past MI Normal cardiovascular exam(-) dysrhythmias + Valvular Problems/Murmurs  Rhythm:regular Rate:Normal     Neuro/Psych negative neurological ROS  negative psych ROS   GI/Hepatic Neg liver ROS,GERD  Controlled,,  Endo/Other    Morbid obesity  Renal/GU negative Renal ROS  negative genitourinary   Musculoskeletal   Abdominal   Peds  Hematology negative hematology ROS (+)   Anesthesia Other Findings Past Medical History: No date: Asthma No date: Heart murmur   Reproductive/Obstetrics negative OB ROS                             Anesthesia Physical Anesthesia Plan  ASA: 3  Anesthesia Plan: Spinal   Post-op Pain Management:    Induction:   PONV Risk Score and Plan:   Airway Management Planned: Natural Airway and Nasal Cannula  Additional Equipment:   Intra-op Plan:   Post-operative Plan:   Informed Consent: I have reviewed the patients History and Physical, chart, labs and discussed the procedure including the risks, benefits and alternatives for the proposed anesthesia with the patient or authorized representative who has indicated his/her understanding and acceptance.     Dental Advisory Given  Plan Discussed  with: Anesthesiologist, CRNA and Surgeon  Anesthesia Plan Comments:        Anesthesia Quick Evaluation

## 2022-10-16 NOTE — Anesthesia Procedure Notes (Signed)
Spinal  Patient location during procedure: OR Start time: 10/16/2022 10:55 AM End time: 10/16/2022 10:58 AM Reason for block: surgical anesthesia Staffing Performed: other anesthesia staff and resident/CRNA  Anesthesiologist: Martha Clan, MD Resident/CRNA: Norm Salt, CRNA Other anesthesia staff: Lenord Fellers, RN Performed by: Lily Peer, Liborio Saccente, CRNA Authorized by: Martha Clan, MD   Preanesthetic Checklist Completed: patient identified, IV checked, site marked, risks and benefits discussed, surgical consent, monitors and equipment checked, pre-op evaluation and timeout performed Spinal Block Patient position: sitting Prep: ChloraPrep Patient monitoring: heart rate, continuous pulse ox and blood pressure Approach: midline Location: L3-4 Injection technique: single-shot Needle Needle type: Pencan  Needle gauge: 24 G Needle length: 10 cm Assessment Events: CSF return

## 2022-10-17 ENCOUNTER — Encounter: Payer: Self-pay | Admitting: Obstetrics and Gynecology

## 2022-10-17 LAB — CBC
HCT: 31 % — ABNORMAL LOW (ref 36.0–46.0)
Hemoglobin: 10.2 g/dL — ABNORMAL LOW (ref 12.0–15.0)
MCH: 29.5 pg (ref 26.0–34.0)
MCHC: 32.9 g/dL (ref 30.0–36.0)
MCV: 89.6 fL (ref 80.0–100.0)
Platelets: 206 10*3/uL (ref 150–400)
RBC: 3.46 MIL/uL — ABNORMAL LOW (ref 3.87–5.11)
RDW: 15.5 % (ref 11.5–15.5)
WBC: 12.3 10*3/uL — ABNORMAL HIGH (ref 4.0–10.5)
nRBC: 0 % (ref 0.0–0.2)

## 2022-10-17 NOTE — Anesthesia Postprocedure Evaluation (Signed)
Anesthesia Post Note  Patient: Lindsey Thompson  Procedure(s) Performed: [place]  Patient location during evaluation: Mother Baby Anesthesia Type: Spinal Level of consciousness: awake, awake and alert and oriented Pain management: pain level controlled Vital Signs Assessment: post-procedure vital signs reviewed and stable Respiratory status: spontaneous breathing, nonlabored ventilation and respiratory function stable Cardiovascular status: blood pressure returned to baseline and stable Postop Assessment: no backache and no headache Anesthetic complications: no  No notable events documented.   Last Vitals:  Vitals:   10/17/22 0300 10/17/22 0431  BP:  (!) 116/59  Pulse: 88 95  Resp:  18  Temp:  36.8 C  SpO2: 94% 97%    Last Pain:  Vitals:   10/17/22 0747  TempSrc:   PainSc: 5                  [employer]

## 2022-10-17 NOTE — Lactation Note (Signed)
This note was copied from a baby's chart. Lactation Consultation Note  Patient Name: Lindsey Thompson [id number] Date: 10/17/2022 Age:29 hours Reason for consult: Follow-up assessment;Primapara;Term  Lactation check-in.  Maternal Data Has patient been taught Hand Expression?: Yes Does the patient have breastfeeding experience prior to this delivery?: No  Mom feeling well, dad supportive at bedside.  Feeding Mother's Current Feeding Choice: Breast Milk  Baby has continued to exclusively BF. Baby has void and stooled, bili appropriate levels, and minimal weight loss (1%).  Mom and dad attempting to encourage baby to feed upon entry.  Mom had baby in more laid back position at her side. LC verbalized positioning/alignment instructions to dad to help with positioning- turning baby in towards mom, moving arm from in between baby and mom. Mom did well with sandwiching breast tissue behind the nipple. Baby would open wide and suck 3-4 bursts and then sleep. A few attempts to keep baby engaged, and feeding, but baby relaxed and sleepy.  LATCH Score Latch: Repeated attempts needed to sustain latch, nipple held in mouth throughout feeding, stimulation needed to elicit sucking reflex.  Audible Swallowing: None  Type of Nipple: Everted at rest and after stimulation  Comfort (Breast/Nipple): Soft / non-tender  Hold (Positioning): Assistance needed to correctly position infant at breast and maintain latch.  LATCH Score: 6   Lactation Tools Discussed/Used    Interventions Interventions: Assisted with latch;Hand express;Education (Newborn feeding patterns, 24-48hrs feeding 8-12x; potential for cluster feeding, position/alignment of baby)  Guidance given for day 2 feeding expectations 24-28 hours- feeding patterns, frequency/cluster feeding, and output expectations.  Discharge Pump: Personal  Consult Status Consult Status: Follow-up    Lavonia Drafts 10/17/2022, 9:28 AM

## 2022-10-17 NOTE — Anesthesia Post-op Follow-up Note (Signed)
  Anesthesia Pain Follow-up Note  Patient: Lindsey Thompson  Day #: 1  Date of Follow-up: 10/17/2022 Time: 7:57 AM  Last Vitals:  Vitals:   10/17/22 0300 10/17/22 0431  BP:  (!) 116/59  Pulse: 88 95  Resp:  18  Temp:  36.8 C  SpO2: 94% 97%    Level of Consciousness: alert  Pain: none   Side Effects:None  Catheter Site Exam:clean, dry  Anti-Coag Meds (From admission, onward)    Start     Dose/Rate Route Frequency Ordered Stop   10/16/22 2200  enoxaparin (LOVENOX) injection 75 mg        75 mg Subcutaneous Every 24 hours 10/16/22 1927          Plan: D/C from anesthesia care at surgeon's request  [facility]

## 2022-10-17 NOTE — Progress Notes (Signed)
Post Partum Day 1 Subjective: Doing well, no complaints.  Tolerating regular diet, pain with PO meds, ambulating without difficulty. Foley catheter in place and has not voided yet.  No CP SOB Fever,Chills, N/V or leg pain; denies nipple or breast pain, no HA change of vision, RUQ/epigastric pain  Objective: BP (!) 116/59   Pulse 95   Temp 98.2 F (36.8 C) (Oral)   Resp 18   Ht 5\' 7"  (1.702 m)   Wt (!) 153.8 kg   LMP 01/10/2022 (Approximate)   SpO2 97%   Breastfeeding Unknown   BMI 53.10 kg/m    Physical Exam:  General: NAD Breasts: soft/nontender PRN CV: RRR Pulm: nl effort, CTABL Abdomen: soft, NT, BS x 4 Incision:  Dsg CDI/wound vac in place Lochia: moderate Uterine Fundus: fundus firm and 1 fb below umbilicus DVT Evaluation: no cords, ttp LEs   Recent Labs    10/15/22 1849 10/17/22 0702  HGB 13.5 10.2*  HCT 41.1 31.0*  WBC 10.8* 12.3*  PLT 247 206    Assessment/Plan: 27 y.o. G1P1001 postpartum day # 1  - Continue routine PP care - Lactation consult PRN - Discussed contraceptive options including implant, IUDs hormonal and non-hormonal, injection, pills/ring/patch, condoms, and NFP.  - Acute blood loss anemia - hemodynamically stable and asymptomatic; start po ferrous sulfate BID with stool softeners  - Immunization status: all Imms up to date  Disposition: Does not desire Dc home today.   Gertie Fey, CNM 10/17/2022 7:47 AM

## 2022-10-17 NOTE — Lactation Note (Signed)
This note was copied from a baby's chart. Lactation Consultation Note  Patient Name: Lindsey Thompson [id number] Date: 10/17/2022 Age:29 hours Reason for consult: Follow-up assessment;Primapara;Mother's request   Maternal Data Has patient been taught Hand Expression?: Yes Does the patient have breastfeeding experience prior to this delivery?: No  Feeding Mother's Current Feeding Choice: Breast Milk  LATCH Score Latch: Repeated attempts needed to sustain latch, nipple held in mouth throughout feeding, stimulation needed to elicit sucking reflex.  Audible Swallowing: A few with stimulation  Type of Nipple: Everted at rest and after stimulation  Comfort (Breast/Nipple): Soft / non-tender  Hold (Positioning): Assistance needed to correctly position infant at breast and maintain latch.  LATCH Score: 7  Baby showing early calm signs of hunger. Clothed with zip up sleeper gown. Placed in football hold on R  breast. A few attempts before latch sustained. Rhythmic suck pattern established, and swallows identified.  Baby off breast after 7 minutes; did not re-latch even with hand expression.  Lactation Tools Discussed/Used    Interventions Interventions: Breast feeding basics reviewed;Assisted with latch;Hand express;Adjust position;Support pillows;Position options;Education  Discharge Pump: Personal  Consult Status Consult Status: Follow-up    Lavonia Drafts 10/17/2022, 10:38 AM

## 2022-10-18 DIAGNOSIS — Z98891 History of uterine scar from previous surgery: Secondary | ICD-10-CM

## 2022-10-18 MED ORDER — SENNOSIDES-DOCUSATE SODIUM 8.6-50 MG PO TABS: 2.0000 | ORAL_TABLET | Freq: Every evening | ORAL | Status: AC | PRN

## 2022-10-18 MED ORDER — OXYCODONE-ACETAMINOPHEN 5-325 MG PO TABS: 1.0000 | ORAL_TABLET | ORAL | 0 refills | Status: AC | PRN

## 2022-10-18 MED ORDER — IBUPROFEN 600 MG PO TABS: 600.0000 mg | ORAL_TABLET | Freq: Four times a day (QID) | ORAL | Status: AC | PRN

## 2022-10-18 NOTE — Lactation Note (Signed)
This note was copied from a baby's chart. Lactation Consultation Note  Patient Name: Lindsey Thompson [id number] Date: 10/18/2022 Age:28 hours Reason for consult: Follow-up assessment;Primapara;Term (c-section , macrosomia)   Maternal Data This is mom's 1st baby, Primary C/S for macrosomia. Mom with history of anxiety and obesity.  On follow-up today mom reports she was having some positioning challenges when latching her baby.   Has patient been taught Hand Expression?: Yes Does the patient have breastfeeding experience prior to this delivery?: No  Feeding Mother's Current Feeding Choice: Breast Milk Assisted mom with maximizing position and latch techniques. Baby latched deeper and multiple swallows were heard. Mom reports she feel better about positioning and latching. Dad very supportive assisted mom with positioning baby in football hold. LATCH Score Latch: Grasps breast easily, tongue down, lips flanged, rhythmical sucking.  Audible Swallowing: Spontaneous and intermittent  Type of Nipple: Everted at rest and after stimulation  Comfort (Breast/Nipple): Soft / non-tender  Hold (Positioning): Assistance needed to correctly position infant at breast and maintain latch.  LATCH Score: 9   Interventions Interventions: Breast feeding basics reviewed;Breast compression;Adjust position;Support pillows;Education  Discharge Discharge Education: Engorgement and breast care;Warning signs for feeding baby;Outpatient recommendation Pump: Personal  Consult Status Consult Status: Complete Date: 10/18/22 Follow-up type: In-patient  Update provided to care nurse.  Jonna Alyona Romack 10/18/2022, 2:48 PM

## 2022-10-18 NOTE — Progress Notes (Signed)
Patient discharged home with family.  Discharge instructions, when to follow up, and prescriptions reviewed with patient.  Patient verbalized understanding. Patient will be escorted out by auxiliary.   

## 2022-10-18 NOTE — Discharge Instructions (Signed)
Discharge instructions:   Call office if you have any of the following:  headache, visual changes, fever >101.0 F, chills, breast concerns (engorgement, mastitis) excessive vaginal bleeding, incision drainage or problems, leg pain or redness, depression or any other concerns.   Activity: Do not lift > 10 lbs for 6 weeks.  No intercourse or tampons for 6 weeks.  No driving for 1-2 weeks or while taking pain medication. No strenuous activity or heavy lifting for 6 weeks.  No swimming pools, hot tubs or tub baths- showers only.    It is normal to bleed for up to 6 weeks. You should not soak through more than 1 pad in 1 hour.   Continue prenatal vitamin. Increase calories and fluids while breastfeeding.  Your milk will come in, in the next couple of days (right now it is colostrum).  You may have a slight fever when your milk comes in, but it should go away on its own.   If it does not, and rises above 101 F please call the doctor.  You will also feel achy and your breasts will be firm. They will also start to leak.  If you are breastfeeding, continue as you have been and you can pump/express milk for comfort.   For concerns about your baby, please call your pediatrician For breastfeeding concerns, the lactation consultant can be reached at [phone]  Postpartum blues (feelings of happy one minute and sad another minute) are normal for the first few weeks but if it gets worse let your doctor know.   

## 2022-10-23 ENCOUNTER — Ambulatory Visit: Payer: Self-pay

## 2022-10-23 NOTE — Lactation Note (Signed)
This note was copied from a baby's chart. Lactation Consultation Note  Patient Name: Lindsey Thompson [id number] Date: 10/23/2022 Age:29 days Reason for consult: Follow-up assessment;Primapara;Mother's request;Other (Comment) (Referral; outpatient)  Mom contacted lactation department on 10/22/2022 after pediatrician appointment for a lactation feeding assessment due to weight loss in newborn infant.  Lindsey Thompson was born on 10/16/2024 to a P1 mom via scheduled c-section at 39w due to macrosomia. Lindsey Thompson weighed 5640g at birth; discharge weight of 5385g.  Pediatrician appointment on 10/22/2022 weight: 10lb14oz= [id number] A estimated 12.5% weight loss. Parents report that jaundice levels were within range- pediatrician not concerned. Parents also report that wet diapers have remained frequent on average of 8-9/24 hours, however prior to pediatrician appointment Lindsey Thompson had not stooled for 48 hours, but most recently has had 2 stools in the last 12 hours since supplementation began.  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 This can over time impact that amount of milk volume being produced.  Pediatrician has recommended for baby to BF for 20 minutes and then be given 1-2oz formula supplementation if mom not able to express appropriate volumes. Parents started Bern on Organic milk based formula yesterday afternoon- tolerating well.  Maternal Data Has patient been taught Hand Expression?: Yes Does the patient have breastfeeding experience prior to this delivery?: No  P1, c/s, with EBL of 1960mL. Mom currently on Lexapro, Lovenox, and prenatal vitamins, occasional tylenol for pain/discomfort.  Mom didn't recognize any breast changes since delivery, no fullness/swelling/discomfort in the tissue. Expressed milk through hand expression today notes milky white breast  milk  Mom has a larger BMI and large breasts, well everted nipples and compressible breast tissue.   Feeding Mother's Current Feeding Choice: Breast Milk  Feeding assessment performed today. Begin weight: 5036g  Baby latched in preferred football hold on the R breast. Baby able to grasp the tissue easily and maintain the latch however was quickly falling asleep and not putting in the work. LC suggested trying a different position that leaves him a bit more exposed to the air and possibly not as cozy/comfortable. Baby repositioned in laid back hold with mom's breast supported by rolled up newborn blanket, and support pillows added under baby to bring him to breast level. Again, Lindsey Thompson latched easily, maintained the latch and seemed to have loud audible swallows. Strategies implemented to keep baby alert/awake and feeding consistently. At 15 minutes he became sleepy and pulled away, milk dribbling from his mouth. Sat him up and then re-latched for additional 5 minutes.   End Weight: 5046g- Indicates a transfer of 58mL.  LATCH Score Latch: Grasps breast easily, tongue down, lips flanged, rhythmical sucking.  Audible Swallowing: Spontaneous and intermittent  Type of Nipple: Everted at rest and after stimulation  Comfort (Breast/Nipple): Soft / non-tender  Hold (Positioning): Assistance needed to correctly position infant at breast and maintain latch. (tried new position)  LATCH Score: 9  Lindsey Thompson's loud noises at the breast appear to give false signs of transfer. The audible swallows, based on the weight change from feeding, do not appear to be adequate swallows of adequate volumes.   Lindsey Thompson did not struggle to maintain latch, but does struggle to transfer appropriate volumes despite his behavior and appearance of his latch- Mouth wide, flanged lips, rhythmic sucking pattern.  Lactation Tools Discussed/Used Tools: Nipple [place] Fuel;Pump Nipple shield size: 24 Breast pump type:  Double-Electric Breast  Pump Pump Education: Setup, frequency, and cleaning;Milk Storage (hand out provided) Reason for Pumping: stimulation post feeding Pumping frequency: every 3 hours (post BF) Pumped volume: 5 mL  LC had mom utilize the DEBP that is in the office for the purpose of breast stimulation of the L breast that was not used during the feeding and also to properly fit for flange sizing. Size 43mm flange appears appropriate and will be appropriate for her use with the Spectra pump that she received last night. Thorough education given on pump techniques, hands on pumping, pumping frequency, and cleaning, milk storage.  Interventions Interventions: Breast feeding basics reviewed;Assisted with latch;Breast massage;Hand express;Breast compression;Adjust position;Support pillows;Position options;Expressed milk;DEBP;Education (feeding plan)  LC recommends feeding plan that pediatrician recommended to continue. Feeding plan: -BF every 3 hours minimum, sooner if baby is cuing -Supplementation of 1-2oz post BF -Use of DEBP for 15 minutes post pumping for additional stimulation/milk removal/build milk supply.  LC recommends BF to be limited to no more than 20 minutes, bottle feeding to 10 minutes for total time of 30 minute feedings.  Information provided for area pediatric dentists for consultation on the need of tongue and lip tie release to aid in the improving milk transfer and aiding in weight gain.  Parents on board with feeding plan. Encouraged with new position that baby fed in. Understand the importance of following plan for best weight gain for Lindsey Thompson.  Discharge Discharge Education: Warning signs for feeding baby;Other (comment) (may reschedule next week) Pump: Personal;Hands Free (mom cozy; Spectra)  Consult Status Consult Status: Complete Date: 10/23/22 Follow-up type: Call as needed  Pediatrician appointment on 10/25/2022 Phone call follow-up on 10/31/2022 with [facility]  Lactation [place] 10/23/2022, 3:58 PM

## 2022-11-07 ENCOUNTER — Ambulatory Visit: Payer: Self-pay

## 2022-11-07 NOTE — Lactation Note (Signed)
This note was copied from a baby's chart. Lactation Consultation Note  Patient Name: Lindsey Thompson [id number] Date: 11/07/2022 Age:29 wk.o. Reason for consult: Follow-up assessment;Other (Comment) (outpatient)  Return follow-up visit post TT/Lip release.  Lindsey Thompson was seen at [facility] in [place] on 11/05/2022 for a tongue and lip tie release. Follow up appointment made for 11/12/2022; wounds appear to be healing well and parents are following stretches 4-5x/day as directed.  Lindsey Thompson was last seen at his pediatrician's office as well on 11/05/2022 for weight check. Parents report his weight was 12lb2oz at that visit. Pediatrician is happy with weight gain process and encourages to continue with current feeding routine.  Current Feeding Routine: -On demand/no more than 3 hours: BF for 20 minutes -Provided 3-4oz supplement: EBM, formula, or mixture of the two -Mom pumps for 15 minutes with Spectra pump  Maternal Data Has patient been taught Hand Expression?: Yes Does the patient have breastfeeding experience prior to this delivery?: No  Mom with history noted as: Heart Murmur and Asthma. Overall mom reports a healthy history. She does have obese BMI pre-pregnancy. This pregnancy was delivered via c-section due to macrosomia.  Mom reports producing 1/4-1/2oz per breast per pumping session. Pumping every 3 hours since last visit on 10/23/2022 (2 weeks ago)  Medications: Lexapro, Valtrex, PNV, and recently started a lactation supplement (unsure of the name, but using as recommended on the label)  Feeding Mother's Current Feeding Choice: Breast Milk  Beginning Weight: 5460g Feeding started at 1417 Feeding ended at 1436 End Weight: 5464g Total Transfer indicated as: 4g  Mom was independent in positioning and latching Lindsey Thompson, dad provided support pillows under mom's arms and under baby. Baby positioned in cradle hold, tummy turned in, mom used one hand to sandwich the breast  tissue and elbow of other arm to bring him to the breast. He latched without additional assistance. LC did use gloved finger to help flange out the upper lip and to check the bottom lip (bottom lip was flanged nicely). Lindsey Thompson demonstrated strong rhythmic sucking pattern and had what appeared to be loud/audible swallowing and deep jaw movement of a 3:1 ratio throughout the feeding.  Lindsey Thompson's body language changed from tense and eager to feed to relaxed and limp towards the end of the feeding. When Lindsey Thompson was removed from the breast the nipple was round with milk on the end, and milk visible in Lindsey Thompson's mouth.  LATCH Score Latch: Grasps breast easily, tongue down, lips flanged, rhythmical sucking.  Audible Swallowing: Spontaneous and intermittent (Lindsey Thompson makes noises and has jaw drop movements that are mimicing swallowing)  Type of Nipple: Everted at rest and after stimulation  Comfort (Breast/Nipple): Soft / non-tender  Hold (Positioning): No assistance needed to correctly position infant at breast.  LATCH Score: 10   Lactation Tools Discussed/Used    Interventions Interventions: Education;Support pillows (Feeding observation)  LC recommended to continue with current feeding practices allowing time for Lindsey Thompson to continue to improve with his latching and re-learning how to use his tongue.   LC will speak with another Lactation Consultant to confer re: next steps or additional strategies to help improve mother's milk supply.  LC provided mom with praise for her hard work and her continued dedication to providing Lindsey Thompson with as much breast milk as possible.  Discharge Discharge Education: Outpatient recommendation Pump: Personal (Spectra)  Consult Status Consult Status: Complete  LC to follow-up with mom and dad once she has conferred with colleagues.  Plan is to  call/speak with mom and dad over the phone with next steps/additional strategies.  Danford Bad 11/07/2022, 3:12  PM

## 2022-11-14 ENCOUNTER — Ambulatory Visit: Payer: Self-pay

## 2022-11-14 NOTE — Lactation Note (Signed)
This note was copied from a baby's chart. Lactation Consultation Note  Patient Name: Lindsey Thompson [id number] Date: 11/14/2022 Age:29 wk.o. Reason for consult: Follow-up assessment;Other (Comment) (outpatient)  Return follow-up visit. 1 week ago family present for feeding evaluation. Following last appointment mom contacted her provider for blood work to check thyroid and prolactin levels, as well as inquire about Reglan. She completed blood work- all within normal ranges per mom, and has taken her Reglan with 1 more pill left.   Family had follow-up appointment with Dr. Rogelia Thompson on Monday 11/12/2022- reports healing to be going well, no concerns. Lindsey Thompson has not returned to the pediatrician's office since 11/05/2022 and doesn't having one coming up until 2 mo wellness.   Current feeding routine: -On demand feeding every 2-3 hours ( at the breast + supplement) -Providing 3-5oz supplement -Mom pumping (Pumps every 2hrs during the day and every 4-4.5hrs at night)  Maternal Data Has patient been taught Hand Expression?: Yes Does the patient have breastfeeding experience prior to this delivery?: No  Mom on her last day of Reglan prescription notes an increase in supply of "maybe" 0.25oz for total pumping output of 45mL per breast post BF.  Feeding Mother's Current Feeding Choice: Breast Milk  This LC recommended to meet with another LC as well for another opinion.   Beginning Weight: 5672g Feeding started: 1408 Feeding ended: 1428 Ending weight: 5682g Total transfer indicated as: 12mL  Mom remains independent with positioning and latching Lindsey Thompson, dad again provided support pillows for both mom's arms and under baby. Baby in cradle hold, tummy in, and mom sandwiched breast. Baby latched first to L breast- flanged lips. Lindsey Thompson started strong with rhythmic suck pattern and audible swallows. LC, Pat, observed deep jaw movement that had chin almost hitting baby's chest, this movement  could be possibly disrupting vacuum to compensate for fast flow/let down at some point in the breastfeeding journey. LC, Pat able to direct mom on providing chin support. Baby was more rhythmic with suck and swallow pattern in bursts of 4-5 suck/swallows before taking a break. Extra stimulation was needed after 7 mins into the feeding- Feeding continued until the 10 minute mark. Baby re-weighed for transfer of 10mL, and brought back to R breast. Mom reports that her R breast has more volume and her and Lindsey Thompson tend to have more difficulty with feeding from the R side. LC's observe that Lindsey Thompson was not as aggressive with feeding on this side doing shallow suck patterns, even with chin support, and then started to discontinue the feeding. Total transfer of 2mL on this side.  Parents reported that baby also has difficulty with bottle feeding. They report that baby spills so much volume out of his mouth that they have to change clothes after each feeding; even with pace-bottle feeding that was shown last night. Dad prepared the supplementation bottle. LC's observed bottle feeding and recommended mom to provide chin support during the feeding like she did during breast feeding. Baby sat upright/elevated, chin supported, less spillage than the parents report he has been doing. Throughout the feeding as the baby became tired or the flow was increased spillage increased causing baby to be repositioned and the bottle to be tilted so the flow was slower. Baby did not finish the 4oz bottle, leaving .5oz.  LATCH Score Latch: Grasps breast easily, tongue down, lips flanged, rhythmical sucking.  Audible Swallowing: Spontaneous and intermittent  Type of Nipple: Everted at rest and after stimulation  Comfort (Breast/Nipple): Soft / non-tender  Hold (Positioning): No assistance needed to correctly position infant at breast.  LATCH Score: 10   Lactation Tools Discussed/Used    Interventions Interventions:  Breast feeding basics reviewed;Breast compression;Support pillows;Education;Pace feeding (Chin support)  Due to the deep jaw drop of the baby while at the breast possibly affecting the overall vacuum to compensate for a previous feeding experience early during the breastfeeding journey, and excess formula spillage during bottle supplementation the Lactation team recommends a referral to a pediatric OT/SLP to assist with further feeding evaluation.  LC, Pat, spoke with parents about overall BF journey and goals. Praised provided for their dedication to give their baby breast milk, the benefits of that small volumes still provide to the baby, the bonding that continues to take place during breastfeeding. LC spoke to changes in feeding plan if mom becomes weary with her efforts, slow progression of weaning from pumping if needed. However, it was recommended to try and have baby further evaluated before making a decision one way or the other for feeding plan- to continue on current feeding plan for now.  LC team also recommended to mom for her to contact her provider re: tapering off of Reglan instead of hard stop as there is some instances in where the milk supply decreases with a sudden stop; mom verbalizes understanding and will reach out.  Discharge Discharge Education: Outpatient recommendation Pump: Personal  Consult Status Consult Status: Complete Date: 11/14/22 Follow-up type: Call as needed  Encouraged parents to keep the Lactation team updated on future findings and pediatricians take on OT/SLP evaluation-   Lindsey Thompson 11/14/2022, 3:40 PM

## 2023-09-04 ENCOUNTER — Other Ambulatory Visit: Payer: Self-pay | Admitting: Medical Genetics

## 2023-12-31 LAB — OB RESULTS CONSOLE HEPATITIS B SURFACE ANTIGEN: Hepatitis B Surface Ag: NEGATIVE

## 2023-12-31 LAB — OB RESULTS CONSOLE GC/CHLAMYDIA
Chlamydia: NEGATIVE
Neisseria Gonorrhea: NEGATIVE

## 2023-12-31 LAB — OB RESULTS CONSOLE VARICELLA ZOSTER ANTIBODY, IGG: Varicella: IMMUNE

## 2023-12-31 LAB — OB RESULTS CONSOLE RUBELLA ANTIBODY, IGM: Rubella: IMMUNE

## 2024-04-22 LAB — OB RESULTS CONSOLE HIV ANTIBODY (ROUTINE TESTING): HIV: NONREACTIVE

## 2024-04-22 LAB — OB RESULTS CONSOLE RPR: RPR: NONREACTIVE

## 2024-05-01 ENCOUNTER — Other Ambulatory Visit: Payer: Self-pay | Admitting: Genetic Counselor

## 2024-05-01 DIAGNOSIS — Z006 Encounter for examination for normal comparison and control in clinical research program: Secondary | ICD-10-CM

## 2024-05-12 ENCOUNTER — Institutional Professional Consult (permissible substitution)

## 2024-05-18 ENCOUNTER — Encounter
Admission: RE | Admit: 2024-05-18 | Discharge: 2024-05-18 | Disposition: A | Discharge status: Home / Self Care | Source: Ambulatory Visit | Attending: Obstetrics and Gynecology | Admitting: Obstetrics and Gynecology

## 2024-05-18 NOTE — Consult Note (Signed)
 [facility] Anesthesia Consultation  Lindsey Thompson FMW:[id number] DOB: 29-Nov-1995 DOA: 05/18/2024 PCP: Auston Reyes BIRCH, MD   Requesting physician: A. Vernel, CNM Date of consultation: 05/18/2024 Reason for consultation: Obesity during pregnancy  CHIEF COMPLAINT:  Obesity during pregnancy  HISTORY OF PRESENT ILLNESS: Lindsey Thompson  is a 28 y.o. female with a known history of class 4 obesity   PAST MEDICAL HISTORY:   Past Medical History:  Diagnosis Date   Asthma    Heart murmur     PAST SURGICAL HISTORY:  Past Surgical History:  Procedure Laterality Date   CESAREAN SECTION N/A 10/16/2022   Procedure: CESAREAN SECTION;  Surgeon: Leonce Garnette BIRCH, MD;  Location: [facility] ORS;  Service: Obstetrics;  Laterality: N/A;   TYMPANOPLASTY     WISDOM TOOTH EXTRACTION Bilateral 2014    SOCIAL HISTORY:  Social History   Tobacco Use   Smoking status: Never   Smokeless tobacco: Never  Substance Use Topics   Alcohol use: No    FAMILY HISTORY: No family history on file.  DRUG ALLERGIES: No Known Allergies  REVIEW OF SYSTEMS:   RESPIRATORY: No cough, shortness of breath, wheezing.  CARDIOVASCULAR: No chest pain, orthopnea, edema.  HEMATOLOGY: No anemia, easy bruising or bleeding SKIN: No rash or lesion. NEUROLOGIC: No tingling, numbness, weakness.  PSYCHIATRY: No anxiety or depression.   MEDICATIONS AT HOME:  Prior to Admission medications   Medication Sig Start Date End Date Taking? Authorizing Provider  COLLAGEN PO Take by mouth. Collagen and Biotin    [provider]  Docosahexaenoic Acid (DHA PO) Take by mouth.    [provider]  escitalopram  (LEXAPRO ) 10 MG tablet Take 10 mg by mouth daily.    [provider]  Ferrous Fumarate (IRON) 18 MG TBCR Take by mouth.    [provider]  Folic Acid (FOLATE PO) Take 600 mcg by mouth.    [provider]  ibuprofen  (ADVIL ) 600 MG tablet Take 1  tablet (600 mg total) by mouth every 6 (six) hours as needed. 10/18/22   Myron Nest, CNM  Magnesium 250 MG TABS Take by mouth.    [provider]  OVER THE COUNTER MEDICATION Prenatal probiotioc    [provider]  OVER THE COUNTER MEDICATION 750 mg. Total Monolaurin    [provider]  oxyCODONE -acetaminophen  (PERCOCET/ROXICET) 5-325 MG tablet Take 1 tablet by mouth every 4 (four) hours as needed for moderate pain or severe pain. 10/18/22   Myron Nest, CNM  Prenatal MV-Min-Fe Fum-FA-DHA (PRENATAL 1 PO) Take by mouth.    [provider]  senna-docusate (SENOKOT-S) 8.6-50 MG tablet Take 2 tablets by mouth at bedtime as needed for mild constipation. 10/18/22   Myron Nest, CNM  valACYclovir (VALTREX) 1000 MG tablet Take 1,000 mg by mouth 2 (two) times daily.    [provider]      PHYSICAL EXAMINATION:   VITAL SIGNS: unknown if currently breastfeeding.  GENERAL:  28 y.o.-year-old patient no acute distress.  HEENT: Head atraumatic, normocephalic. Oropharynx and nasopharynx clear. MP 3, TM distance >3 cm, normal mouth opening. LUNGS: No use of accessory muscles of respiration.   EXTREMITIES: No pedal edema, cyanosis, or clubbing.  NEUROLOGIC: normal gait PSYCHIATRIC: The patient is alert and oriented x 3.  SKIN: No obvious rash, lesion, or ulcer.    IMPRESSION AND PLAN:   Lindsey Thompson  is a 28 y.o. female presenting with obesity during pregnancy. BMI is currently 52 at [redacted] weeks gestation.  We discussed analgesic options during labor including epidural analgesia. Discussed that in obesity there can be increased difficulty with epidural placement or even failure of successful epidural. We also discussed that even after successful epidural placement there is increased risk of catheter migration out of the epidural space that would require catheter replacement. Discussed use of epidural vs spinal vs GA if cesarean delivery is  required. Discussed increased risk of difficult intubation during pregnancy should an emergency cesarean delivery be required.   This patient is appropriate for anesthetic care at [facility] in [place].  We discussed the limitations of a community hospital including but not limited to staffing, imaging, medications, and blood products.  The patient is aware of these limitations.  All questions answered and concerns addressed.    Lindsey Thompson L. Chesley, MD  Anesthesiology

## 2024-06-17 LAB — OB RESULTS CONSOLE GBS: GBS: POSITIVE

## 2024-07-06 ENCOUNTER — Inpatient Hospital Stay
Admission: RE | Admit: 2024-07-06 | Discharge: 2024-07-06 | Attending: Obstetrics and Gynecology | Admitting: Obstetrics and Gynecology

## 2024-07-06 ENCOUNTER — Encounter: Payer: Self-pay | Admitting: Urgent Care

## 2024-07-06 ENCOUNTER — Encounter
Admission: RE | Admit: 2024-07-06 | Discharge: 2024-07-06 | Disposition: A | Source: Ambulatory Visit | Attending: Obstetrics and Gynecology | Admitting: Obstetrics and Gynecology

## 2024-07-06 ENCOUNTER — Other Ambulatory Visit: Payer: Self-pay

## 2024-07-06 DIAGNOSIS — O0993 Supervision of high risk pregnancy, unspecified, third trimester: Secondary | ICD-10-CM

## 2024-07-06 DIAGNOSIS — Z01812 Encounter for preprocedural laboratory examination: Secondary | ICD-10-CM

## 2024-07-06 LAB — TYPE AND SCREEN
ABO/RH(D): A POS
Antibody Screen: NEGATIVE
Extend sample reason: UNDETERMINED

## 2024-07-06 LAB — CBC
HCT: 38.8 % (ref 36.0–46.0)
Hemoglobin: 12.5 g/dL (ref 12.0–15.0)
MCH: 28.4 pg (ref 26.0–34.0)
MCHC: 32.2 g/dL (ref 30.0–36.0)
MCV: 88.2 fL (ref 80.0–100.0)
Platelets: 269 K/uL (ref 150–400)
RBC: 4.4 MIL/uL (ref 3.87–5.11)
RDW: 14.3 % (ref 11.5–15.5)
WBC: 8.6 K/uL (ref 4.0–10.5)
nRBC: 0 % (ref 0.0–0.2)

## 2024-07-06 MED ORDER — LACTATED RINGERS IV SOLN: INTRAVENOUS | Status: DC

## 2024-07-06 MED ORDER — LACTATED RINGERS IV SOLN
INTRAVENOUS | Status: DC
  Filled 2024-07-06: qty 1000

## 2024-07-06 MED ORDER — LACTATED RINGERS IV SOLN: Freq: Once | INTRAVENOUS | Status: DC

## 2024-07-06 NOTE — Patient Instructions (Addendum)
 Your procedure is scheduled on: Wednesday 07/08/24 with a 5:45 am arrival to the Emergency Department  REMEMBER: Instructions that are not followed completely may result in serious medical risk, up to and including death; or upon the discretion of your surgeon and anesthesiologist your surgery may need to be rescheduled.  Do not eat food or drink any liquids after midnight the night before surgery.  No gum chewing or hard candies.  Continue taking all of your other prescription medications up until the day of surgery.  ON THE DAY OF SURGERY ONLY TAKE THESE MEDICATIONS WITH SIPS OF WATER:  valACYclovir (VALTREX) 1000 MG tablet   No Alcohol for 24 hours before or after surgery.  No Smoking including e-cigarettes for 24 hours before surgery.  No chewable tobacco products for at least 6 hours before surgery.  No nicotine patches on the day of surgery.  Do not use any recreational drugs for at least a week (preferably 2 weeks) before your surgery.  Please be advised that the combination of cocaine and anesthesia may have negative outcomes, up to and including death. If you test positive for cocaine, your surgery will be cancelled.  On the morning of surgery brush your teeth with toothpaste and water, you may rinse your mouth with mouthwash if you wish. Do not swallow any toothpaste or mouthwash.  Use CHG Soap or wipes as directed on instruction sheet. (You can pick this up at our office in the [facility], the building to the left of the [employer], [address] at [address]  Do not shave body hair from the neck down 48 hours before surgery.  Do not wear lotions, powders, or perfumes.   Wear comfortable clothing (specific to your surgery type) to the hospital.  Do not wear jewelry, make-up, hairpins, clips or nail polish.  For welded (permanent) jewelry: bracelets, anklets, waist bands, etc.  Please have this removed prior to surgery.  If it is not  removed, there is a chance that hospital personnel will need to cut it off on the day of surgery.  Contact lenses, hearing aids and dentures may not be worn into surgery.  Do not bring valuables to the hospital. [facility] is not responsible for any missing/lost belongings or valuables.   Notify your doctor if there is any change in your medical condition (cold, fever, infection).  After surgery, you can help prevent lung complications by doing breathing exercises.  Take deep breaths and cough every 1-2 hours. Your doctor may order a device called an Incentive Spirometer to help you take deep breaths.  When coughing or sneezing, hold a pillow firmly against your incision with both hands. This is called splinting. Doing this helps protect your incision. It also decreases belly discomfort.  Please call the Pre-admissions Testing Dept. at [phone] if you have any questions about these instructions.  Inpatient Visitation:    All Locations: Visiting hours are from 7 a.m. to 8 p.m. (unless stated otherwise).  *Starting at 7 a.m. on Dec. 10, 2025, due to an increase in RSV and influenza rates and associated hospitalization, children ages 102 and under may not visit patients in [facility] hospitals.   *Visitation is not restricted outside of hospitals, unless noted in the [facility] and Location   *Specific Visitation Guidelines below. Some age restrictions may still apply in select locations as noted in the Campus and Location  1. Laboring women may have one designated support person and two other  visitors age 36 and    older visit. 2. The support person must remain the same. 3. Any child must be supervised at all times by a person aged 18 or over who is not the patient.  4. The visitors may switch with other visitors. 5. Visitation is permitted 24 hours per day. 6. A doula registered with [place] for labor and delivery support is not considered a visitor. 7. Doulas not registered  with [place] are considered visitors.   Merchandiser, Retail to address health-related social needs:  [email or web address]                                                                                                             Preparing for Surgery with CHLORHEXIDINE  GLUCONATE (CHG) Soap  Chlorhexidine  Gluconate (CHG) Soap  o An antiseptic cleaner that kills germs and bonds with the skin to continue killing germs even after washing  o Used for showering the night before surgery and morning of surgery  Before surgery, you can play an important role by reducing the number of germs on your skin.  CHG (Chlorhexidine  gluconate) soap is an antiseptic cleanser which kills germs and bonds with the skin to continue killing germs even after washing.  Please do not use if you have an allergy to CHG or antibacterial soaps. If your skin becomes reddened/irritated stop using the CHG.  1. Shower the NIGHT BEFORE SURGERY with CHG soap.  2. If you choose to wash your hair, wash your hair first as usual with your normal shampoo.  3. After shampooing, rinse your hair and body thoroughly to remove the shampoo.  4. Use CHG as you would any other liquid soap. You can apply CHG directly to the skin and wash gently with a clean washcloth.  5. Apply the CHG soap to your body only from the neck down. Do not use on open wounds or open sores. Avoid contact with your eyes, ears, mouth,  breasts and genitals (private parts). Wash face and genitals (private parts) with your normal soap.  6. Wash thoroughly, paying special attention to the area where your surgery will be performed.  7. Thoroughly rinse your body with warm water.  8. Do not shower/wash with your normal soap after using and rinsing off the CHG soap.  9. Do not use lotions, oils, etc., after showering with CHG.  10. Pat yourself dry with a clean towel.  11. Wear clean pajamas to bed the night before surgery.  12.  Place clean sheets on your bed the night of your shower and do not sleep with pets.  13. Do not apply any deodorants/lotions/powders.  14. Please wear clean clothes to the hospital.  15. Remember to brush your teeth with your regular toothpaste.

## 2024-07-07 MED ORDER — ORAL CARE MOUTH RINSE: 15.0000 mL | Freq: Once | OROMUCOSAL | Status: DC

## 2024-07-07 MED ORDER — CHLORHEXIDINE GLUCONATE 0.12 % MT SOLN
15.0000 mL | Freq: Once | OROMUCOSAL | Status: DC
  Filled 2024-07-07: qty 15

## 2024-07-07 MED ORDER — CEFAZOLIN SODIUM-DEXTROSE 3-4 GM/150ML-% IV SOLN
3.0000 g | INTRAVENOUS | Status: DC
  Filled 2024-07-07: qty 150

## 2024-07-07 MED ORDER — LACTATED RINGERS IV SOLN: Freq: Once | INTRAVENOUS | Status: AC

## 2024-07-07 NOTE — H&P (Signed)
 OB History & Physical   History of Present Illness:  Chief Complaint: Here for repeat c-section  HPI:  Lindsey Thompson is a 28 y.o. G87P1001 female at Unknown dated by 7 week ultrasound.  Her pregnancy has been complicated by obesity with BMI >50, history of c-section with macrosomic infant (12 lbs 9.6 oz), history of gHTN.    She denies contractions.   She denies leakage of fluid.   She denies vaginal bleeding.   She reports fetal movement.    Total weight gain for pregnancy: 55 lbs   Last ultrasound (06/22/2024): EFW > 99%ile, (10 lbs 0oz, 4,545 grams), AC > 99th %ile.    Maternal Medical History:   Past Medical History:  Diagnosis Date   Asthma    Heart murmur     Past Surgical History:  Procedure Laterality Date   CESAREAN SECTION N/A 10/16/2022   Procedure: CESAREAN SECTION;  Surgeon: Leonce Garnette BIRCH, MD;  Location: [facility] ORS;  Service: Obstetrics;  Laterality: N/A;   TYMPANOPLASTY     WISDOM TOOTH EXTRACTION Bilateral 2014    Allergies[1]  Prior to Admission medications  Medication Sig Start Date End Date Taking? Authorizing Provider  aspirin EC 81 MG tablet Take 81 mg by mouth daily. Swallow whole.   Yes [provider]  Docosahexaenoic Acid (PRENATAL DHA PO) Take 1 capsule by mouth daily.   Yes [provider]    OB History  Gravida Para Term Preterm AB Living  2 1 1   1   SAB IAB Ectopic Multiple Live Births     0 1    # Outcome Date GA Lbr Len/2nd Weight Sex Type Anes PTL Lv  2 Current           1 Term 10/16/22 [redacted]w[redacted]d  5640 g M CS-LTranv Spinal  LIV    Prenatal care site: [facility]  Social History: She  reports that she has never smoked. She has never used smokeless tobacco. She reports that she does not drink alcohol and does not use drugs.  Family History: family history is not on file.   Review of Systems  Constitutional: Negative.   HENT: Negative.    Eyes: Negative.   Respiratory: Negative.    Cardiovascular:  Negative.   Gastrointestinal: Negative.   Genitourinary: Negative.   Musculoskeletal: Negative.   Skin: Negative.   Neurological: Negative.   Psychiatric/Behavioral: Negative.       Physical Exam:  There were no vitals taken for this visit.  Physical Exam Constitutional:      General: She is not in acute distress.    Appearance: Normal appearance. She is well-developed.  HENT:     Head: Normocephalic and atraumatic.  Eyes:     General: No scleral icterus.    Conjunctiva/sclera: Conjunctivae normal.  Cardiovascular:     Rate and Rhythm: Normal rate and regular rhythm.     Heart sounds: No murmur heard.    No friction rub. No gallop.  Pulmonary:     Effort: Pulmonary effort is normal. No respiratory distress.     Breath sounds: Normal breath sounds. No wheezing or rales.  Abdominal:     General: Bowel sounds are normal. There is no distension.     Palpations: Abdomen is soft. There is mass (gravid, NT).     Tenderness: There is no abdominal tenderness. There is no guarding or rebound.  Musculoskeletal:        General: Normal range of motion.     Cervical  back: Normal range of motion and neck supple.  Neurological:     General: No focal deficit present.     Mental Status: She is alert and oriented to person, place, and time.     Cranial Nerves: No cranial nerve deficit.  Skin:    General: Skin is warm and dry.     Findings: No erythema.  Psychiatric:        Mood and Affect: Mood normal.        Behavior: Behavior normal.        Judgment: Judgment normal.      Pertinent Results:  Prenatal Labs Blood type/Rh A positive  Antibody screen negative  Rubella Not immune 12/31/2023)  Varicella Immune    RPR NR (12/31/2023, 04/22/2024)  HBsAg negative  HIV Negative (12/31/2023, 04/22/2024)  GC negative  Chlamydia negative  Genetic screening Diploid XY  1 hour GTT 108  3 hour GTT N/a  GBS positive on 06/17/2024   Placenta: anterior  Assessment:  Lindsey Thompson is a  28 y.o. G9P1001 female at [redacted]w[redacted]d with history of c-section, fetal macrosomia, morbid obesity.   Plan:  Admit to Labor & Delivery  CBC, T&S, NPO, IVF GBS positive.   Consents signed. Desires to proceed to OR    Garnette Mace, MD 07/07/2024 1:37 PM        [1] No Known Allergies

## 2024-07-07 NOTE — H&P (View-Only) (Signed)
 OB History & Physical   History of Present Illness:  Chief Complaint: Here for repeat c-section  HPI:  Lindsey Thompson is a 28 y.o. G87P1001 female at Unknown dated by 7 week ultrasound.  Her pregnancy has been complicated by obesity with BMI >50, history of c-section with macrosomic infant (12 lbs 9.6 oz), history of gHTN.    She denies contractions.   She denies leakage of fluid.   She denies vaginal bleeding.   She reports fetal movement.    Total weight gain for pregnancy: 55 lbs   Last ultrasound (06/22/2024): EFW > 99%ile, (10 lbs 0oz, 4,545 grams), AC > 99th %ile.    Maternal Medical History:   Past Medical History:  Diagnosis Date   Asthma    Heart murmur     Past Surgical History:  Procedure Laterality Date   CESAREAN SECTION N/A 10/16/2022   Procedure: CESAREAN SECTION;  Surgeon: Leonce Garnette BIRCH, MD;  Location: [facility] ORS;  Service: Obstetrics;  Laterality: N/A;   TYMPANOPLASTY     WISDOM TOOTH EXTRACTION Bilateral 2014    Allergies[1]  Prior to Admission medications  Medication Sig Start Date End Date Taking? Authorizing Provider  aspirin EC 81 MG tablet Take 81 mg by mouth daily. Swallow whole.   Yes [provider]  Docosahexaenoic Acid (PRENATAL DHA PO) Take 1 capsule by mouth daily.   Yes [provider]    OB History  Gravida Para Term Preterm AB Living  2 1 1   1   SAB IAB Ectopic Multiple Live Births     0 1    # Outcome Date GA Lbr Len/2nd Weight Sex Type Anes PTL Lv  2 Current           1 Term 10/16/22 [redacted]w[redacted]d  5640 g M CS-LTranv Spinal  LIV    Prenatal care site: [facility]  Social History: She  reports that she has never smoked. She has never used smokeless tobacco. She reports that she does not drink alcohol and does not use drugs.  Family History: family history is not on file.   Review of Systems  Constitutional: Negative.   HENT: Negative.    Eyes: Negative.   Respiratory: Negative.    Cardiovascular:  Negative.   Gastrointestinal: Negative.   Genitourinary: Negative.   Musculoskeletal: Negative.   Skin: Negative.   Neurological: Negative.   Psychiatric/Behavioral: Negative.       Physical Exam:  There were no vitals taken for this visit.  Physical Exam Constitutional:      General: She is not in acute distress.    Appearance: Normal appearance. She is well-developed.  HENT:     Head: Normocephalic and atraumatic.  Eyes:     General: No scleral icterus.    Conjunctiva/sclera: Conjunctivae normal.  Cardiovascular:     Rate and Rhythm: Normal rate and regular rhythm.     Heart sounds: No murmur heard.    No friction rub. No gallop.  Pulmonary:     Effort: Pulmonary effort is normal. No respiratory distress.     Breath sounds: Normal breath sounds. No wheezing or rales.  Abdominal:     General: Bowel sounds are normal. There is no distension.     Palpations: Abdomen is soft. There is mass (gravid, NT).     Tenderness: There is no abdominal tenderness. There is no guarding or rebound.  Musculoskeletal:        General: Normal range of motion.     Cervical  back: Normal range of motion and neck supple.  Neurological:     General: No focal deficit present.     Mental Status: She is alert and oriented to person, place, and time.     Cranial Nerves: No cranial nerve deficit.  Skin:    General: Skin is warm and dry.     Findings: No erythema.  Psychiatric:        Mood and Affect: Mood normal.        Behavior: Behavior normal.        Judgment: Judgment normal.      Pertinent Results:  Prenatal Labs Blood type/Rh A positive  Antibody screen negative  Rubella Not immune 12/31/2023)  Varicella Immune    RPR NR (12/31/2023, 04/22/2024)  HBsAg negative  HIV Negative (12/31/2023, 04/22/2024)  GC negative  Chlamydia negative  Genetic screening Diploid XY  1 hour GTT 108  3 hour GTT N/a  GBS positive on 06/17/2024   Placenta: anterior  Assessment:  Lindsey Thompson is a  28 y.o. G9P1001 female at [redacted]w[redacted]d with history of c-section, fetal macrosomia, morbid obesity.   Plan:  Admit to Labor & Delivery  CBC, T&S, NPO, IVF GBS positive.   Consents signed. Desires to proceed to OR    Garnette Mace, MD 07/07/2024 1:37 PM        [1] No Known Allergies

## 2024-07-08 ENCOUNTER — Inpatient Hospital Stay
Admission: EM | Admit: 2024-07-08 | Discharge: 2024-07-10 | Disposition: A | Source: Home / Self Care | Attending: Obstetrics and Gynecology | Admitting: Obstetrics and Gynecology

## 2024-07-08 ENCOUNTER — Encounter: Payer: Self-pay | Admitting: Obstetrics and Gynecology

## 2024-07-08 ENCOUNTER — Other Ambulatory Visit: Payer: Self-pay

## 2024-07-08 ENCOUNTER — Inpatient Hospital Stay: Admission: RE | Admit: 2024-07-08 | Admitting: Obstetrics and Gynecology

## 2024-07-08 ENCOUNTER — Inpatient Hospital Stay: Payer: Self-pay | Admitting: Urgent Care

## 2024-07-08 ENCOUNTER — Encounter: Admission: RE | Disposition: A | Payer: Self-pay | Attending: Obstetrics and Gynecology

## 2024-07-08 ENCOUNTER — Encounter: Payer: Self-pay | Admitting: Urgent Care

## 2024-07-08 DIAGNOSIS — Z3A39 39 weeks gestation of pregnancy: Secondary | ICD-10-CM

## 2024-07-08 DIAGNOSIS — O99214 Obesity complicating childbirth: Secondary | ICD-10-CM | POA: Diagnosis present

## 2024-07-08 DIAGNOSIS — Z98891 History of uterine scar from previous surgery: Principal | ICD-10-CM

## 2024-07-08 DIAGNOSIS — O0993 Supervision of high risk pregnancy, unspecified, third trimester: Secondary | ICD-10-CM

## 2024-07-08 DIAGNOSIS — O34211 Maternal care for low transverse scar from previous cesarean delivery: Secondary | ICD-10-CM | POA: Diagnosis present

## 2024-07-08 DIAGNOSIS — Z7982 Long term (current) use of aspirin: Secondary | ICD-10-CM | POA: Diagnosis not present

## 2024-07-08 DIAGNOSIS — Z01812 Encounter for preprocedural laboratory examination: Secondary | ICD-10-CM

## 2024-07-08 DIAGNOSIS — O99824 Streptococcus B carrier state complicating childbirth: Secondary | ICD-10-CM | POA: Diagnosis present

## 2024-07-08 DIAGNOSIS — O99210 Obesity complicating pregnancy, unspecified trimester: Secondary | ICD-10-CM | POA: Insufficient documentation

## 2024-07-08 SURGERY — Surgical Case
Anesthesia: Epidural | Site: Abdomen

## 2024-07-08 MED ORDER — OXYTOCIN-SODIUM CHLORIDE 30-0.9 UT/500ML-% IV SOLN
INTRAVENOUS | Status: AC
  Filled 2024-07-08: qty 500

## 2024-07-08 MED ORDER — LACTATED RINGERS IV SOLN: Freq: Once | INTRAVENOUS | Status: AC

## 2024-07-08 MED ORDER — 0.9 % SODIUM CHLORIDE (POUR BTL) OPTIME
TOPICAL | Status: DC | PRN
  Administered 2024-07-08: 09:00:00 550 mL

## 2024-07-08 MED ORDER — OXYCODONE-ACETAMINOPHEN 5-325 MG PO TABS
2.0000 | ORAL_TABLET | ORAL | Status: DC | PRN
  Administered 2024-07-09 – 2024-07-10 (×4): 2 via ORAL
  Filled 2024-07-08 (×5): qty 2

## 2024-07-08 MED ORDER — OXYCODONE HCL 5 MG PO TABS: 5.0000 mg | ORAL_TABLET | Freq: Once | ORAL | Status: DC | PRN

## 2024-07-08 MED ORDER — ORAL CARE MOUTH RINSE: 15.0000 mL | Freq: Once | OROMUCOSAL | Status: AC

## 2024-07-08 MED ORDER — SIMETHICONE 80 MG PO CHEW
80.0000 mg | CHEWABLE_TABLET | Freq: Three times a day (TID) | ORAL | Status: DC
  Administered 2024-07-08 – 2024-07-10 (×6): 80 mg via ORAL
  Filled 2024-07-08 (×6): qty 1

## 2024-07-08 MED ORDER — WITCH HAZEL-GLYCERIN EX PADS: 1.0000 | MEDICATED_PAD | CUTANEOUS | Status: DC | PRN

## 2024-07-08 MED ORDER — KETOROLAC TROMETHAMINE 30 MG/ML IJ SOLN: 30.0000 mg | Freq: Four times a day (QID) | INTRAMUSCULAR | Status: AC | PRN

## 2024-07-08 MED ORDER — SOD CITRATE-CITRIC ACID 500-334 MG/5ML PO SOLN
ORAL | Status: AC
  Filled 2024-07-08: qty 15

## 2024-07-08 MED ORDER — ONDANSETRON HCL 4 MG/2ML IJ SOLN
INTRAMUSCULAR | Status: DC | PRN
  Administered 2024-07-08: 08:00:00 4 mg via INTRAVENOUS

## 2024-07-08 MED ORDER — OXYCODONE-ACETAMINOPHEN 5-325 MG PO TABS
1.0000 | ORAL_TABLET | ORAL | Status: DC | PRN
  Administered 2024-07-10: 1 via ORAL
  Filled 2024-07-08: qty 1

## 2024-07-08 MED ORDER — PHENYLEPHRINE 80 MCG/ML (10ML) SYRINGE FOR IV PUSH (FOR BLOOD PRESSURE SUPPORT)
PREFILLED_SYRINGE | INTRAVENOUS | Status: DC | PRN
  Administered 2024-07-08 (×2): 80 ug via INTRAVENOUS

## 2024-07-08 MED ORDER — PHENYLEPHRINE HCL-NACL 20-0.9 MG/250ML-% IV SOLN
INTRAVENOUS | Status: DC | PRN
  Administered 2024-07-08: 08:00:00 50 ug/min via INTRAVENOUS

## 2024-07-08 MED ORDER — FENTANYL CITRATE (PF) 100 MCG/2ML IJ SOLN: 25.0000 ug | INTRAMUSCULAR | Status: DC | PRN

## 2024-07-08 MED ORDER — LACTATED RINGERS IV SOLN: INTRAVENOUS | Status: DC

## 2024-07-08 MED ORDER — MEPERIDINE HCL 25 MG/ML IJ SOLN: 6.2500 mg | INTRAMUSCULAR | Status: DC | PRN

## 2024-07-08 MED ORDER — KETOROLAC TROMETHAMINE 30 MG/ML IJ SOLN
INTRAMUSCULAR | Status: DC | PRN
  Administered 2024-07-08: 09:00:00 30 mg via INTRAVENOUS

## 2024-07-08 MED ORDER — LIDOCAINE HCL (PF) 1 % IJ SOLN
INTRAMUSCULAR | Status: DC | PRN
  Administered 2024-07-08: 08:00:00 3 mL

## 2024-07-08 MED ORDER — CEFAZOLIN SODIUM-DEXTROSE 3-4 GM/150ML-% IV SOLN
3.0000 g | INTRAVENOUS | Status: AC
  Administered 2024-07-08: 08:00:00 3 g via INTRAVENOUS

## 2024-07-08 MED ORDER — FERROUS SULFATE 325 (65 FE) MG PO TABS
325.0000 mg | ORAL_TABLET | Freq: Two times a day (BID) | ORAL | Status: DC
  Administered 2024-07-08 – 2024-07-10 (×4): 325 mg via ORAL
  Filled 2024-07-08 (×4): qty 1

## 2024-07-08 MED ORDER — NALOXONE HCL 0.4 MG/ML IJ SOLN: 0.4000 mg | INTRAMUSCULAR | Status: DC | PRN

## 2024-07-08 MED ORDER — OXYTOCIN-SODIUM CHLORIDE 30-0.9 UT/500ML-% IV SOLN
2.5000 [IU]/h | INTRAVENOUS | Status: DC
  Administered 2024-07-08: 11:00:00 2.5 [IU]/h via INTRAVENOUS
  Filled 2024-07-08: qty 500

## 2024-07-08 MED ORDER — MORPHINE SULFATE (PF) 0.5 MG/ML IJ SOLN
INTRAMUSCULAR | Status: DC | PRN
  Administered 2024-07-08: 08:00:00 100 ug via INTRATHECAL

## 2024-07-08 MED ORDER — ACETAMINOPHEN 10 MG/ML IV SOLN: 1000.0000 mg | Freq: Once | INTRAVENOUS | Status: DC | PRN

## 2024-07-08 MED ORDER — DIBUCAINE (PERIANAL) 1 % EX OINT: 1.0000 | TOPICAL_OINTMENT | CUTANEOUS | Status: DC | PRN

## 2024-07-08 MED ORDER — SOD CITRATE-CITRIC ACID 500-334 MG/5ML PO SOLN
30.0000 mL | ORAL | Status: AC
  Administered 2024-07-08: 08:00:00 30 mL via ORAL

## 2024-07-08 MED ORDER — NALOXONE HCL 4 MG/10ML IJ SOLN
1.0000 ug/kg/h | INTRAVENOUS | Status: DC | PRN
  Filled 2024-07-08: qty 5

## 2024-07-08 MED ORDER — DIPHENHYDRAMINE HCL 25 MG PO CAPS: 25.0000 mg | ORAL_CAPSULE | Freq: Four times a day (QID) | ORAL | Status: DC | PRN

## 2024-07-08 MED ORDER — BUPIVACAINE IN DEXTROSE 0.75-8.25 % IT SOLN
INTRATHECAL | Status: DC | PRN
  Administered 2024-07-08: 08:00:00 1.6 mL via INTRATHECAL

## 2024-07-08 MED ORDER — OXYCODONE HCL 5 MG/5ML PO SOLN: 5.0000 mg | Freq: Once | ORAL | Status: DC | PRN

## 2024-07-08 MED ORDER — FENTANYL CITRATE (PF) 100 MCG/2ML IJ SOLN
INTRAMUSCULAR | Status: DC | PRN
  Administered 2024-07-08: 08:00:00 15 ug via INTRATHECAL

## 2024-07-08 MED ORDER — ONDANSETRON HCL 4 MG/2ML IJ SOLN: 4.0000 mg | Freq: Three times a day (TID) | INTRAMUSCULAR | Status: DC | PRN

## 2024-07-08 MED ORDER — ACETAMINOPHEN 500 MG PO TABS
1000.0000 mg | ORAL_TABLET | Freq: Four times a day (QID) | ORAL | Status: AC
  Administered 2024-07-08 – 2024-07-09 (×4): 1000 mg via ORAL
  Filled 2024-07-08 (×4): qty 2

## 2024-07-08 MED ORDER — ONDANSETRON HCL 4 MG/2ML IJ SOLN
INTRAMUSCULAR | Status: AC
  Filled 2024-07-08: qty 2

## 2024-07-08 MED ORDER — IBUPROFEN 600 MG PO TABS: 600.0000 mg | ORAL_TABLET | Freq: Four times a day (QID) | ORAL | Status: DC

## 2024-07-08 MED ORDER — DROPERIDOL 2.5 MG/ML IJ SOLN: 0.6250 mg | Freq: Once | INTRAMUSCULAR | Status: DC | PRN

## 2024-07-08 MED ORDER — DIPHENHYDRAMINE HCL 50 MG/ML IJ SOLN
12.5000 mg | INTRAMUSCULAR | Status: DC | PRN
  Administered 2024-07-08: 14:00:00 12.5 mg via INTRAVENOUS
  Filled 2024-07-08: qty 1

## 2024-07-08 MED ORDER — KETOROLAC TROMETHAMINE 30 MG/ML IJ SOLN
30.0000 mg | Freq: Four times a day (QID) | INTRAMUSCULAR | Status: AC | PRN
  Administered 2024-07-08 – 2024-07-09 (×2): 30 mg via INTRAVENOUS
  Filled 2024-07-08 (×2): qty 1

## 2024-07-08 MED ORDER — PRENATAL MULTIVITAMIN CH
1.0000 | ORAL_TABLET | Freq: Every day | ORAL | Status: DC
  Administered 2024-07-09 – 2024-07-10 (×2): 1 via ORAL
  Filled 2024-07-08 (×2): qty 1

## 2024-07-08 MED ORDER — MENTHOL 3 MG MT LOZG: 1.0000 | LOZENGE | OROMUCOSAL | Status: DC | PRN

## 2024-07-08 MED ORDER — OXYTOCIN-SODIUM CHLORIDE 30-0.9 UT/500ML-% IV SOLN
INTRAVENOUS | Status: DC | PRN
  Administered 2024-07-08: 08:00:00 500 mL via INTRAVENOUS

## 2024-07-08 MED ORDER — SOD CITRATE-CITRIC ACID 500-334 MG/5ML PO SOLN: 30.0000 mL | ORAL | Status: DC

## 2024-07-08 MED ORDER — CHLORHEXIDINE GLUCONATE 0.12 % MT SOLN
15.0000 mL | Freq: Once | OROMUCOSAL | Status: AC
  Administered 2024-07-08: 07:00:00 15 mL via OROMUCOSAL

## 2024-07-08 MED ORDER — SENNOSIDES-DOCUSATE SODIUM 8.6-50 MG PO TABS
2.0000 | ORAL_TABLET | ORAL | Status: DC
  Administered 2024-07-09: 14:00:00 2 via ORAL
  Filled 2024-07-08 (×2): qty 2

## 2024-07-08 MED ORDER — OXYCODONE HCL 5 MG PO TABS
5.0000 mg | ORAL_TABLET | ORAL | Status: DC | PRN
  Administered 2024-07-08 – 2024-07-09 (×2): 5 mg via ORAL
  Filled 2024-07-08 (×2): qty 1

## 2024-07-08 MED ORDER — DEXAMETHASONE SOD PHOSPHATE PF 10 MG/ML IJ SOLN
INTRAMUSCULAR | Status: DC | PRN
  Administered 2024-07-08: 08:00:00 10 mg via INTRAVENOUS

## 2024-07-08 MED ORDER — DIPHENHYDRAMINE HCL 25 MG PO CAPS: 25.0000 mg | ORAL_CAPSULE | ORAL | Status: DC | PRN

## 2024-07-08 MED ORDER — SODIUM CHLORIDE 0.9% FLUSH: 3.0000 mL | INTRAVENOUS | Status: DC | PRN

## 2024-07-08 MED ORDER — COCONUT OIL OIL
1.0000 | TOPICAL_OIL | Status: DC | PRN
  Filled 2024-07-08: qty 7.5

## 2024-07-08 MED FILL — Phenylephrine-NaCl IV Solution 20 MG/250ML-0.9%: INTRAVENOUS | Qty: 250 | Status: AC

## 2024-07-08 MED FILL — Fentanyl Citrate Preservative Free (PF) Inj 100 MCG/2ML: INTRAMUSCULAR | Qty: 2 | Status: AC

## 2024-07-08 MED FILL — Morphine Sulfate Inj PF 0.5 MG/ML: INTRAMUSCULAR | Qty: 10 | Status: AC

## 2024-07-08 SURGICAL SUPPLY — 38 items
BENZOIN TINCTURE PRP APPL 2/3 (GAUZE/BANDAGES/DRESSINGS) ×1 IMPLANT
CATH KIT ON-Q SILVERSOAK 5 (CATHETERS) ×2 IMPLANT
CHLORAPREP W/TINT 26 (MISCELLANEOUS) IMPLANT
DERMABOND ADVANCED .7 DNX12 (GAUZE/BANDAGES/DRESSINGS) ×1 IMPLANT
DRESSING PEEL AND PLAC PRVNA20 (GAUZE/BANDAGES/DRESSINGS) IMPLANT
DRSG OPSITE POSTOP 4X10 (GAUZE/BANDAGES/DRESSINGS) ×1 IMPLANT
DRSG TEGADERM 4X4.75 (GAUZE/BANDAGES/DRESSINGS) IMPLANT
DRSG TEGADERM 6X8 (GAUZE/BANDAGES/DRESSINGS) IMPLANT
DRSG TELFA 3X8 NADH STRL (GAUZE/BANDAGES/DRESSINGS) ×1 IMPLANT
ELECT CAUTERY BLADE 6.4 (BLADE) ×1 IMPLANT
ELECTRODE REM PT RTRN 9FT ADLT (ELECTROSURGICAL) ×1 IMPLANT
GAUZE SPONGE 4X4 12PLY STRL (GAUZE/BANDAGES/DRESSINGS) ×1 IMPLANT
GLOVE BIO SURGEON STRL SZ7 (GLOVE) ×1 IMPLANT
GLOVE BIOGEL PI IND STRL 7.5 (GLOVE) IMPLANT
GLOVE INDICATOR 7.5 STRL GRN (GLOVE) ×1 IMPLANT
GLOVE SURG SYN 6.5 PF PI BL (GLOVE) IMPLANT
GLOVE SURG SYN 7.0 PF PI (GLOVE) IMPLANT
GOWN STRL REUS W/ TWL LRG LVL3 (GOWN DISPOSABLE) ×3 IMPLANT
KIT PREVENA INCISION MGT20CM45 (CANNISTER) IMPLANT
MANIFOLD NEPTUNE II (INSTRUMENTS) ×1 IMPLANT
MAT PREVALON FULL STRYKER (MISCELLANEOUS) ×1 IMPLANT
PACK C SECTION AR (MISCELLANEOUS) ×1 IMPLANT
PAD OB MATERNITY 11 LF (PERSONAL CARE ITEMS) ×2 IMPLANT
PAD PREP OB/GYN DISP 24X41 (PERSONAL CARE ITEMS) ×1 IMPLANT
RETAINER VISCERA MED (MISCELLANEOUS) IMPLANT
RETRACTOR TRAXI PANNICULUS (MISCELLANEOUS) IMPLANT
SCRUB CHG 4% DYNA-HEX 4OZ (MISCELLANEOUS) ×1 IMPLANT
SOLN 0.9% NACL POUR BTL 1000ML (IV SOLUTION) ×1 IMPLANT
SOLN STERILE WATER 500 ML (IV SOLUTION) ×1 IMPLANT
SPONGE T-LAP 18X18 ~~LOC~~+RFID (SPONGE) IMPLANT
STAPLER INSORB 30 2030 C-SECTI (MISCELLANEOUS) IMPLANT
STRIP CLOSURE SKIN 1/2X4 (GAUZE/BANDAGES/DRESSINGS) ×1 IMPLANT
SUCT VACUUM KIWI BELL (SUCTIONS) IMPLANT
SUT PDS AB 1 TP1 96 (SUTURE) ×1 IMPLANT
SUT VIC AB 0 CTX36XBRD ANBCTRL (SUTURE) ×2 IMPLANT
SUT VIC AB 3-0 SH 27X BRD (SUTURE) IMPLANT
SUTURE MNCRL 4-0 27XMF (SUTURE) ×1 IMPLANT
TRAP FLUID SMOKE EVACUATOR (MISCELLANEOUS) ×1 IMPLANT

## 2024-07-08 NOTE — Anesthesia Procedure Notes (Signed)
 Spinal  Patient location during procedure: OR Start time: 07/08/2024 7:49 AM End time: 07/08/2024 7:52 AM Reason for block: surgical anesthesia  Staffing Performed: resident/CRNA  Authorized by: Myra Lynwood MATSU, MD   Performed by: Belinda, Jakyria Bleau, CRNA  Preanesthetic Checklist Completed: patient identified, IV checked, site marked, risks and benefits discussed, surgical consent, monitors and equipment checked and pre-op evaluation Spinal Block Patient position: sitting Prep: ChloraPrep Patient monitoring: heart rate, continuous pulse ox and blood pressure Approach: midline Location: L3-4 Injection technique: single-shot Needle Needle type: Pencan  Needle gauge: 24 G Needle length: 10 cm Assessment Sensory level: T4 Events: CSF return  Additional Notes IV functioning, monitors applied to pt. Expiration date of kit checked and confirmed to be in date. Sterile prep and drape, hand hygiene and sterile gloved used. Pt was positioned and spine was prepped in sterile fashion. Skin was anesthetized with lidocaine . Free flow of clear CSF obtained prior to injecting local anesthetic into CSF x 1 attempt. Spinal needle aspirated freely following injection. Needle was carefully withdrawn, and pt tolerated procedure well. Loss of motor and sensory on exam post injection.

## 2024-07-08 NOTE — Interval H&P Note (Signed)
 History and Physical Interval Note:  07/08/2024 7:21 AM  Lindsey Thompson  has presented today for surgery, with the diagnosis of prior cesarean.  The various methods of treatment have been discussed with the patient and family. After consideration of risks, benefits and other options for treatment, the patient has consented to  Procedures with comments: CESAREAN DELIVERY (N/A) - REPEAT CESAREAN as a surgical intervention.  The patient's history has been reviewed, patient examined, no change in status, stable for surgery.  I have reviewed the patient's chart and labs.  Questions were answered to the patient's satisfaction.    Garnette Mace, MD, [facility] 07/08/2024 7:21 AM

## 2024-07-08 NOTE — Transfer of Care (Signed)
 Immediate Anesthesia Transfer of Care Note  Patient: Lindsey Thompson  Procedure(s) Performed: CESAREAN DELIVERY (Abdomen)  Patient Location: Mother/Baby  Anesthesia Type:Spinal  Level of Consciousness: awake, alert , and oriented  Airway & Oxygen Therapy: Patient Spontanous Breathing  Post-op Assessment: Report given to RN and Post -op Vital signs reviewed and stable  Post vital signs: Reviewed and stable  Last Vitals:  Vitals Value Taken Time  BP 137/66   Temp    Pulse 77 07/08/24 10:11  Resp 21 07/08/24 10:11  SpO2 98 % 07/08/24 10:11    Last Pain:  Vitals:   07/08/24 1011  TempSrc:   PainSc: 0-No pain         Complications: No notable events documented.

## 2024-07-08 NOTE — Anesthesia Preprocedure Evaluation (Signed)
 Anesthesia Evaluation  Patient identified by MRN, date of birth, ID band Patient awake    Reviewed: Allergy & Precautions, H&P , NPO status , Patient's Chart, lab work & pertinent test results, reviewed documented beta blocker date and time   Airway Mallampati: II  TM Distance: >3 FB Neck ROM: full    Dental no notable dental hx. (+) Teeth Intact   Pulmonary neg shortness of breath, asthma    Pulmonary exam normal breath sounds clear to auscultation       Cardiovascular Exercise Tolerance: Good hypertension, On Medications + Valvular Problems/Murmurs  Rhythm:regular Rate:Normal     Neuro/Psych negative neurological ROS  negative psych ROS   GI/Hepatic negative GI ROS, Neg liver ROS,,,  Endo/Other  diabetes, Well Controlled  Class 4 obesity  Renal/GU      Musculoskeletal   Abdominal   Peds  Hematology negative hematology ROS (+)   Anesthesia Other Findings   Reproductive/Obstetrics (+) Pregnancy                              Anesthesia Physical Anesthesia Plan  ASA: 3  Anesthesia Plan: Epidural   Post-op Pain Management:    Induction:   PONV Risk Score and Plan: 3  Airway Management Planned:   Additional Equipment:   Intra-op Plan:   Post-operative Plan:   Informed Consent: I have reviewed the patients History and Physical, chart, labs and discussed the procedure including the risks, benefits and alternatives for the proposed anesthesia with the patient or authorized representative who has indicated his/her understanding and acceptance.       Plan Discussed with:   Anesthesia Plan Comments:         Anesthesia Quick Evaluation

## 2024-07-08 NOTE — Discharge Summary (Shared)
 Postpartum Discharge Summary  Patient Name: Lindsey Thompson DOB: 1996-05-19 MRN: [id number]  Date of admission: 07/08/2024 Delivery date:07/08/2024 Delivering provider: Artemis Loyal D Date of discharge: 07/08/2024  Primary OB: [facility] OFE:Ejupzwu'd last menstrual period was 10/05/2023. EDC Estimated Date of Delivery: 07/11/24 Gestational Age at Delivery: [redacted]w[redacted]d   Admitting diagnosis: History of cesarean delivery [Z98.891] Intrauterine pregnancy: [redacted]w[redacted]d     Secondary diagnosis:   Principal Problem:   History of cesarean delivery Active Problems:   Supervision of high risk pregnancy in third trimester   Obesity affecting pregnancy   [redacted] weeks gestation of pregnancy   Discharge Diagnosis: Term Pregnancy Delivered      Hospital course: Sceduled C/S   28 y.o. yo [id number] at [redacted]w[redacted]d was admitted to the hospital 07/08/2024 for scheduled cesarean section with the following indication:Elective Repeat and Macrosomia.Delivery details are as follows:  Membrane Rupture Time/Date: 8:23 AM,07/08/2024  Delivery Method:C-Section, Low Transverse Operative Delivery:N/A Details of operation can be found in separate operative note.  Patient had a postpartum course complicated by***.  She is ambulating, tolerating a regular diet, passing flatus, and urinating well. Patient is discharged home in stable condition on  07/08/2024        Newborn Data: Birth date:07/08/2024 Birth time:8:24 AM Gender:Female [place] Living status:Living Apgars:9 ,9  Weight:5160 g                                              Post partum procedures:{Postpartum procedures:23558} Complications: {OB Labor/Delivery Complications:20784} Delivery Type: repeat cesarean section, low transverse incision Anesthesia: spinal anesthesia Placenta: spontaneous To Pathology: No   Prenatal Labs:  Prenatal Labs Blood type/Rh A positive  Antibody screen negative  Rubella Not immune 12/31/2023)  Varicella Immune     RPR NR (12/31/2023, 04/22/2024)  HBsAg negative  HIV Negative (12/31/2023, 04/22/2024)  GC negative  Chlamydia negative  Genetic screening Diploid XY  1 hour GTT 108  3 hour GTT N/a  GBS positive on 06/17/2024     Magnesium Sulfate received: No BMZ received: No Rhophylac:was not indicated MMR: was not indicated Varivax vaccine given: was not indicated Tdap vaccine: declined 04/22/2024 Flu vaccine: declined 04/22/2024 RSV vaccine:declined 05/25/2024 COVID - has not received any, declined  Transfusion:{Transfusion received:30440034}  Physical exam  Vitals:   07/08/24 0602 07/08/24 0612 07/08/24 0613 07/08/24 1005  BP:   (!) 109/41   Pulse:   90   Resp:  17    Temp:  97.6 F (36.4 C)  98.1 F (36.7 C)  TempSrc:  Oral  Oral  Weight: (!) 157.4 kg     Height: 5' 7 (1.702 m)      General: {Exam; general:21111117} Lochia: {Desc; appropriate/inappropriate:30686::appropriate} Uterine Fundus: {Desc; firm/soft:30687} Perineum:***minimal edema/{OB Perineal assessment:24215} Incision: {Exam; incision:21111123}, covered with occlusive OP site dressing *** DVT Evaluation: {Exam; dvt:2111122}  Labs: Lab Results  Component Value Date   WBC 8.6 07/06/2024   HGB 12.5 07/06/2024   HCT 38.8 07/06/2024   MCV 88.2 07/06/2024   PLT 269 07/06/2024      Latest Ref Rng & Units 10/15/2022    6:49 PM  CMP  Glucose 70 - 99 mg/dL 75   BUN 6 - 20 mg/dL 11   Creatinine 9.55 - 1.00 mg/dL 9.33   Sodium 864 - 854 mmol/L 135   Potassium 3.5 - 5.1 mmol/L 4.3   Chloride 98 - 111 mmol/L  107   CO2 22 - 32 mmol/L 21   Calcium 8.9 - 10.3 mg/dL 8.8   Total Protein 6.5 - 8.1 g/dL 6.4   Total Bilirubin 0.3 - 1.2 mg/dL 0.5   Alkaline Phos 38 - 126 U/L 131   AST 15 - 41 U/L 19   ALT 0 - 44 U/L 15    Edinburgh Score:    10/17/2022   10:43 AM  Edinburgh Postnatal Depression Scale Screening Tool  I have been able to laugh and see the funny side of things. 0   I have looked forward with enjoyment  to things. 0   I have blamed myself unnecessarily when things went wrong. 1   I have been anxious or worried for no good reason. 2   I have felt scared or panicky for no good reason. 0   Things have been getting on top of me. 0   I have been so unhappy that I have had difficulty sleeping. 0   I have felt sad or miserable. 0   I have been so unhappy that I have been crying. 0   The thought of harming myself has occurred to me. 0   Edinburgh Postnatal Depression Scale Total 3      Data saved with a previous flowsheet row definition     Postpartum VTE Prophylaxis  Recommend 6 weeks of prophylactic anticoagulation with LMWH or subcutaneous unfractionated heparin if 1 or more high risk factor is present.  Recommend 14 days of prophylactic anticoagulation with LMWH or subcutaneous unfractionated heparin if 3 or more moderate risk factors are present.   Risk assessment for postpartum VTE and prophylactic treatment: High risk factors: None Moderate risk factors: Cesarean delivery  and BMI 40-60 kg/m2  Postpartum VTE prophylaxis with LMWH not indicated    After visit meds:  Allergies as of 07/08/2024   No Known Allergies   Med Rec must be completed prior to using this [facility]***      Discharge home in stable condition Infant Feeding: {Baby feeding:23562} Infant Disposition:home with mother Discharge instruction: per After Visit Summary and Postpartum booklet. Activity: Advance as tolerated. Pelvic rest for 6 weeks.  Diet: routine diet Anticipated Birth Control:  {Contraceptives:21111124:::1} Postpartum Appointment:6 weeks Additional Postpartum F/U: Incision check 1 week Future Appointments:No future appointments. Follow up Visit:  Follow-up Information     Leonce Garnette BIRCH, MD. Go on 07/20/2024.   Specialty: Obstetrics and Gynecology Why: For incision check, Post-op follow up, (Keep previously scheduled appointment) Contact information: [address] [place] [address] [phone]                 Plan:  Dondrea Clendenin was discharged to home in good condition. Follow-up appointment as directed.    Signed: *** Hit refresh and delete this line

## 2024-07-08 NOTE — Lactation Note (Signed)
 This note was copied from a baby's chart. Lactation Consultation Note  Patient Name: Lindsey Thompson Today's Date: 07/08/2024 Age:30 hours Reason for consult: L&D Initial assessment;1st time breastfeeding;Term;RN request   Maternal Data  This is moms 2nd baby. C/S. Initial Consult of 1 hour old baby Lindsey Lindsey Thompson. Upon entry of LC and LC students mom was feeding baby on left breast. This was initial feed and mom expressed that everything was going well.   She mentioned that breastfeeding her previous baby was not successful due to low milk supply. Mom expressed that first baby had tongue and lip-tie that was identified at 2 weeks . Mom stated that she has an a great supply until around the fourth month mark. She supplemented with formula. Wants to breastfeed this baby and is hoping her milk production and supply improves this time.  Feeding Mother's Current Feeding Choice: Breast Milk  LATCH Score Latch: Grasps breast easily, tongue down, lips flanged, rhythmical sucking.  Audible Swallowing: Spontaneous and intermittent \  Comfort (Breast/Nipple): Soft / non-tender  Hold (Positioning): No assistance needed to correctly position infant at breast. ([place])     Lactation Tools Discussed/Used  Mom has Spectra  Pump that she used during previously breastfeeding experience  Interventions Interventions: Breast feeding basics reviewed;Education Provided mom with education on feeding frequency (8-12x per 24 hour period) and feeding on demand and hunger cues. We discussed wet/dirty diapers, arousing infant when sleeping.  Patient verbalized understanding.  Discharge Pump: Personal;DEBP (Spectra ) WIC Program: No  Consult Status Consult Status: Follow-up Follow-up type: In-patient    Marianne Daring 07/08/2024, 12:07 PM

## 2024-07-08 NOTE — Op Note (Signed)
 Cesarean Section Operative Note    Patient Name: Lindsey Thompson  Date of Birth: 1996/01/23  MRN: [id number]  Date of Surgery: 07/08/2024   Pre-operative Diagnosis:  1) History of cesarean delivery, desires repeat 2) Suspected fetal macrosomia 3) intrauterine pregnancy at [redacted]w[redacted]d   Post-operative Diagnosis:  1) History of cesarean delivery, desires repeat 2) Suspected fetal macrosomia 3) intrauterine pregnancy at [redacted]w[redacted]d    Procedure: Repeat Cesarean Delivery via pfannenstiel incision with double layer uterine closure  Surgeon: Surgeons and Role:    DEWAINE Leonce Garnette JONETTA, MD - Primary   Assistants: Edsel Blush, CNM; No other capable assistant available, in surgery requiring high level assistant.  Anesthesia: spinal   Findings:  1) normal appearing gravid uterus, fallopian tubes, and ovaries 2) viable female infant with weight 5,160 grams, APGARs 9 and 9   Quantified Blood Loss: 475 mL  Total IV Fluids: 900 ml   Urine Output: 100 mL clear urine at end of case  Specimens: none  Complications: no complications  Disposition: PACU - hemodynamically stable.   Maternal Condition: stable   Baby condition / location:  Couplet care / Skin to Skin  Procedure Details:  The patient was seen in the Holding Room. The risks, benefits, complications, treatment options, and expected outcomes were discussed with the patient. The patient concurred with the proposed plan, giving informed consent. identified as Freddi Frary and the procedure verified as C-Section Delivery. A Time Out was held and the above information confirmed.   After induction of anesthesia, the patient was prepped and draped in the usual sterile manner. A Pfannenstiel incision was made and carried down through the subcutaneous tissue to the fascia. Fascial incision was made and extended transversely. The fascia was separated from the underlying rectus tissue superiorly and inferiorly. The peritoneum was identified and  entered. Peritoneal incision was extended longitudinally. The bladder flap was bluntly and sharply freed from the lower uterine segment due to adhesions of the lower uterine segment. A low transverse uterine incision was made and the hysterotomy was extended with cranial-caudal tension. Delivered from cephalic presentation was a 5,160 gram Living newborn infant(s) or Female with Apgar scores of 9 at one minute and 9 at five minutes. Cord ph was not sent the umbilical cord was clamped and cut cord blood was not obtained for evaluation. The patient desired private cord blood collection. This was performed in accordance with the cord blood bank specifications. The placenta was removed Intact and appeared normal. The uterine outline, tubes and ovaries appeared normal. The uterine incision was closed with running locked sutures of 0 Vicryl.  A second layer of the same suture was thrown in an imbricating fashion.  Hemostasis was assured.  The uterus was returned to the abdomen and the paracolic gutters were cleared of all clots and debris.  The rectus muscles were inspected and found to be hemostatic.  The fascia was then reapproximated with running sutures of 1-0 PDS, looped. The subcutaneous tissue was reapproximated using 2-0 plain gut such that no greater than 2cm of dead space remained. The subcuticular closure was performed using 4-0 monocryl. The skin closure was reinforced using surgical skin glue.  The Provena wound vac was attempted to be placed and consistent suction pressure was not able to be maintained.  An attempt was made to locate the leak and adhesive covering was placed at least twice over nearly the entire closure.  This still did not achieve proper suction pressure. A second wound vac was attempted with the  same result. So, this was abandoned.    The surgical assistant performed tissue retraction, assistance with suturing, and fundal pressure.  Instrument, sponge, and needle counts were correct  prior the abdominal closure and were correct at the conclusion of the case.  The patient received Ancef  3 gram IV prior to skin incision (within 30 minutes). For VTE prophylaxis she was wearing SCDs throughout the case.  The assistant surgeon was a CNM due to lack of availability of another sales promotion account executive.    Signed: Garnette CHARM Mace, MD 07/08/2024 9:57 AM

## 2024-07-09 ENCOUNTER — Encounter: Payer: Self-pay | Admitting: Obstetrics and Gynecology

## 2024-07-09 LAB — CBC
HCT: 34.7 % — ABNORMAL LOW (ref 36.0–46.0)
Hemoglobin: 11.4 g/dL — ABNORMAL LOW (ref 12.0–15.0)
MCH: 28.9 pg (ref 26.0–34.0)
MCHC: 32.9 g/dL (ref 30.0–36.0)
MCV: 88.1 fL (ref 80.0–100.0)
Platelets: 267 K/uL (ref 150–400)
RBC: 3.94 MIL/uL (ref 3.87–5.11)
RDW: 14.4 % (ref 11.5–15.5)
WBC: 11 K/uL — ABNORMAL HIGH (ref 4.0–10.5)
nRBC: 0 % (ref 0.0–0.2)

## 2024-07-09 MED ORDER — IBUPROFEN 600 MG PO TABS
600.0000 mg | ORAL_TABLET | Freq: Four times a day (QID) | ORAL | Status: DC
  Administered 2024-07-09 – 2024-07-10 (×6): 600 mg via ORAL
  Filled 2024-07-09 (×6): qty 1

## 2024-07-09 NOTE — Anesthesia Postprocedure Evaluation (Signed)
 Anesthesia Post Note  Patient: Lorretta Doughman  Procedure(s) Performed: CESAREAN DELIVERY (Abdomen)  Patient location during evaluation: Mother Baby Anesthesia Type: Epidural Level of consciousness: awake and alert and oriented Pain management: pain level controlled Vital Signs Assessment: post-procedure vital signs reviewed and stable Respiratory status: spontaneous breathing and respiratory function stable Cardiovascular status: blood pressure returned to baseline Postop Assessment: no headache, no backache, no apparent nausea or vomiting, adequate PO intake and able to ambulate Anesthetic complications: no   No notable events documented.   Last Vitals:  Vitals:   07/08/24 2355 07/09/24 0416  BP: 134/71 124/70  Pulse: 81 83  Resp: 20 18  Temp: 36.8 C 36.5 C  SpO2: 99% 95%    Last Pain:  Vitals:   07/09/24 0416  TempSrc: Oral  PainSc:                  Francena JONETTA Seip

## 2024-07-09 NOTE — Progress Notes (Signed)
 Post Partum Day 1 Subjective: Doing well, no complaints.  Tolerating regular diet, pain with PO meds, voiding and ambulating without difficulty.  No CP SOB Fever,Chills, N/V or leg pain; denies nipple or breast pain, no HA change of vision, RUQ/epigastric pain  Objective: BP 127/81 (BP Location: Right Arm)   Pulse 70   Temp 98 F (36.7 C) (Oral)   Resp 16   Ht 5' 7 (1.702 m)   Wt (!) 157.4 kg   LMP 10/05/2023   SpO2 99%   Breastfeeding Unknown   BMI 54.35 kg/m    Physical Exam:  General: NAD Breasts: soft/nontender CV: RRR Pulm: nl effort, CTABL Abdomen: soft, NT, BS x 4 Incision: Dsg CDI/Honeycomb dressing intact Lochia: moderate Uterine Fundus: fundus firm and 1 fb below umbilicus DVT Evaluation: no cords, ttp LEs   Recent Labs    07/06/24 1318 07/09/24 0702  HGB 12.5 11.4*  HCT 38.8 34.7*  WBC 8.6 11.0*  PLT 269 267    Assessment/Plan: Lindsey y.o. [id number] postpartum day # 1  - Continue routine PP care - Lactation consult PRN - Discussed contraceptive options including implant, IUDs hormonal and non-hormonal, injection, pills/ring/patch, condoms, and NFP.  - Acute blood loss anemia, clinically insignificant - hemoglobin changed from 12.5 to 11.4, patient is asymptomatic, hemodynamically stable; start po ferrous sulfate  BID with stool softeners,  - Immunization status: Needs MMR prior to DC  Disposition: Does not desire Dc home today.   [name], CNM 07/09/2024 12:57 PM

## 2024-07-10 MED ORDER — ACETAMINOPHEN 325 MG PO TABS: 650.0000 mg | ORAL_TABLET | Freq: Four times a day (QID) | ORAL | Status: AC | PRN

## 2024-07-10 MED ORDER — IBUPROFEN 600 MG PO TABS: 600.0000 mg | ORAL_TABLET | Freq: Four times a day (QID) | ORAL | Status: AC | PRN

## 2024-07-10 MED ORDER — OXYCODONE HCL 5 MG PO TABS: 5.0000 mg | ORAL_TABLET | ORAL | 0 refills | Status: AC | PRN

## 2024-07-10 NOTE — Progress Notes (Signed)
 Pt discharged with infant.  Discharge instructions, prescriptions and follow up appointment given to and reviewed with pt. Pt verbalized understanding. Escorted out by auxillary.
